# Patient Record
Sex: Male | Born: 1950 | Race: White | Hispanic: No | Smoking: Never smoker
Health system: Southern US, Community
[De-identification: ages and names within clinical notes are randomized; demographics above are authoritative.]

## PROBLEM LIST (undated history)

## (undated) DIAGNOSIS — F0390 Unspecified dementia without behavioral disturbance: Secondary | ICD-10-CM

## (undated) DIAGNOSIS — R569 Unspecified convulsions: Secondary | ICD-10-CM

## (undated) DIAGNOSIS — I10 Essential (primary) hypertension: Secondary | ICD-10-CM

## (undated) DIAGNOSIS — F329 Major depressive disorder, single episode, unspecified: Secondary | ICD-10-CM

## (undated) DIAGNOSIS — F419 Anxiety disorder, unspecified: Secondary | ICD-10-CM

## (undated) DIAGNOSIS — R296 Repeated falls: Secondary | ICD-10-CM

## (undated) DIAGNOSIS — K219 Gastro-esophageal reflux disease without esophagitis: Secondary | ICD-10-CM

## (undated) DIAGNOSIS — M6281 Muscle weakness (generalized): Secondary | ICD-10-CM

## (undated) DIAGNOSIS — E876 Hypokalemia: Secondary | ICD-10-CM

## (undated) DIAGNOSIS — E785 Hyperlipidemia, unspecified: Secondary | ICD-10-CM

## (undated) DIAGNOSIS — R1312 Dysphagia, oropharyngeal phase: Secondary | ICD-10-CM

## (undated) DIAGNOSIS — S0990XA Unspecified injury of head, initial encounter: Secondary | ICD-10-CM

## (undated) HISTORY — DX: Dysphagia, oropharyngeal phase: R13.12

## (undated) HISTORY — DX: Gastro-esophageal reflux disease without esophagitis: K21.9

## (undated) HISTORY — PX: TRACHEOSTOMY: SUR1362

## (undated) HISTORY — PX: TRACHEOSTOMY CLOSURE: SHX458

## (undated) HISTORY — DX: Unspecified injury of head, initial encounter: S09.90XA

## (undated) HISTORY — DX: Hyperlipidemia, unspecified: E78.5

---

## 2003-05-23 ENCOUNTER — Emergency Department (HOSPITAL_COMMUNITY): Admission: EM | Admit: 2003-05-23 | Discharge: 2003-05-23 | Payer: Self-pay | Admitting: Emergency Medicine

## 2004-05-02 ENCOUNTER — Inpatient Hospital Stay (HOSPITAL_COMMUNITY): Admission: EM | Admit: 2004-05-02 | Discharge: 2004-05-04 | Payer: Self-pay | Admitting: Emergency Medicine

## 2004-05-05 ENCOUNTER — Ambulatory Visit (HOSPITAL_COMMUNITY): Admission: RE | Admit: 2004-05-05 | Discharge: 2004-05-05 | Payer: Self-pay | Admitting: Family Medicine

## 2004-07-15 ENCOUNTER — Observation Stay (HOSPITAL_COMMUNITY): Admission: EM | Admit: 2004-07-15 | Discharge: 2004-07-16 | Payer: Self-pay | Admitting: Emergency Medicine

## 2004-11-03 ENCOUNTER — Emergency Department (HOSPITAL_COMMUNITY): Admission: EM | Admit: 2004-11-03 | Discharge: 2004-11-03 | Payer: Self-pay | Admitting: Emergency Medicine

## 2005-01-14 ENCOUNTER — Emergency Department (HOSPITAL_COMMUNITY): Admission: EM | Admit: 2005-01-14 | Discharge: 2005-01-14 | Payer: Self-pay | Admitting: Emergency Medicine

## 2005-05-04 ENCOUNTER — Emergency Department (HOSPITAL_COMMUNITY): Admission: EM | Admit: 2005-05-04 | Discharge: 2005-05-04 | Payer: Self-pay | Admitting: Emergency Medicine

## 2005-06-21 ENCOUNTER — Emergency Department (HOSPITAL_COMMUNITY): Admission: EM | Admit: 2005-06-21 | Discharge: 2005-06-21 | Payer: Self-pay | Admitting: Emergency Medicine

## 2005-07-20 ENCOUNTER — Emergency Department (HOSPITAL_COMMUNITY): Admission: EM | Admit: 2005-07-20 | Discharge: 2005-07-20 | Payer: Self-pay | Admitting: Emergency Medicine

## 2005-10-28 ENCOUNTER — Emergency Department (HOSPITAL_COMMUNITY): Admission: EM | Admit: 2005-10-28 | Discharge: 2005-10-28 | Payer: Self-pay | Admitting: Emergency Medicine

## 2005-11-05 ENCOUNTER — Emergency Department (HOSPITAL_COMMUNITY): Admission: EM | Admit: 2005-11-05 | Discharge: 2005-11-05 | Payer: Self-pay | Admitting: Emergency Medicine

## 2005-12-20 ENCOUNTER — Emergency Department (HOSPITAL_COMMUNITY): Admission: EM | Admit: 2005-12-20 | Discharge: 2005-12-20 | Payer: Self-pay | Admitting: Emergency Medicine

## 2006-01-14 ENCOUNTER — Emergency Department (HOSPITAL_COMMUNITY): Admission: EM | Admit: 2006-01-14 | Discharge: 2006-01-14 | Payer: Self-pay | Admitting: Emergency Medicine

## 2006-04-26 ENCOUNTER — Inpatient Hospital Stay (HOSPITAL_COMMUNITY): Admission: EM | Admit: 2006-04-26 | Discharge: 2006-04-27 | Payer: Self-pay | Admitting: Emergency Medicine

## 2006-04-30 ENCOUNTER — Emergency Department (HOSPITAL_COMMUNITY): Admission: EM | Admit: 2006-04-30 | Discharge: 2006-04-30 | Payer: Self-pay | Admitting: Emergency Medicine

## 2006-11-05 ENCOUNTER — Emergency Department (HOSPITAL_COMMUNITY): Admission: EM | Admit: 2006-11-05 | Discharge: 2006-11-05 | Payer: Self-pay | Admitting: Emergency Medicine

## 2007-02-03 ENCOUNTER — Emergency Department (HOSPITAL_COMMUNITY): Admission: EM | Admit: 2007-02-03 | Discharge: 2007-02-03 | Payer: Self-pay | Admitting: Emergency Medicine

## 2007-04-17 ENCOUNTER — Emergency Department (HOSPITAL_COMMUNITY): Admission: EM | Admit: 2007-04-17 | Discharge: 2007-04-17 | Payer: Self-pay | Admitting: Emergency Medicine

## 2007-04-18 ENCOUNTER — Emergency Department (HOSPITAL_COMMUNITY): Admission: EM | Admit: 2007-04-18 | Discharge: 2007-04-18 | Payer: Self-pay | Admitting: Emergency Medicine

## 2007-04-22 ENCOUNTER — Ambulatory Visit: Payer: Self-pay | Admitting: Orthopedic Surgery

## 2007-04-27 ENCOUNTER — Emergency Department (HOSPITAL_COMMUNITY): Admission: EM | Admit: 2007-04-27 | Discharge: 2007-04-27 | Payer: Self-pay | Admitting: Emergency Medicine

## 2007-05-26 ENCOUNTER — Emergency Department (HOSPITAL_COMMUNITY): Admission: EM | Admit: 2007-05-26 | Discharge: 2007-05-26 | Payer: Self-pay | Admitting: Emergency Medicine

## 2007-07-14 ENCOUNTER — Emergency Department (HOSPITAL_COMMUNITY): Admission: EM | Admit: 2007-07-14 | Discharge: 2007-07-14 | Payer: Self-pay | Admitting: Emergency Medicine

## 2007-10-07 ENCOUNTER — Observation Stay (HOSPITAL_COMMUNITY): Admission: EM | Admit: 2007-10-07 | Discharge: 2007-10-08 | Payer: Self-pay | Admitting: Emergency Medicine

## 2007-10-24 ENCOUNTER — Emergency Department (HOSPITAL_COMMUNITY): Admission: EM | Admit: 2007-10-24 | Discharge: 2007-10-24 | Payer: Self-pay | Admitting: Emergency Medicine

## 2007-11-06 ENCOUNTER — Inpatient Hospital Stay (HOSPITAL_COMMUNITY): Admission: EM | Admit: 2007-11-06 | Discharge: 2007-11-07 | Payer: Self-pay | Admitting: Emergency Medicine

## 2007-11-08 ENCOUNTER — Emergency Department (HOSPITAL_COMMUNITY): Admission: EM | Admit: 2007-11-08 | Discharge: 2007-11-08 | Payer: Self-pay | Admitting: Emergency Medicine

## 2007-11-26 ENCOUNTER — Emergency Department (HOSPITAL_COMMUNITY): Admission: EM | Admit: 2007-11-26 | Discharge: 2007-11-26 | Payer: Self-pay | Admitting: Emergency Medicine

## 2008-05-10 ENCOUNTER — Encounter: Payer: Self-pay | Admitting: Emergency Medicine

## 2008-05-11 ENCOUNTER — Inpatient Hospital Stay (HOSPITAL_COMMUNITY): Admission: EM | Admit: 2008-05-11 | Discharge: 2008-05-22 | Payer: Self-pay | Admitting: Internal Medicine

## 2008-05-14 ENCOUNTER — Encounter (INDEPENDENT_AMBULATORY_CARE_PROVIDER_SITE_OTHER): Payer: Self-pay | Admitting: Internal Medicine

## 2008-09-10 ENCOUNTER — Emergency Department (HOSPITAL_COMMUNITY): Admission: EM | Admit: 2008-09-10 | Discharge: 2008-09-10 | Payer: Self-pay | Admitting: Emergency Medicine

## 2008-11-16 ENCOUNTER — Emergency Department (HOSPITAL_COMMUNITY): Admission: EM | Admit: 2008-11-16 | Discharge: 2008-11-16 | Payer: Self-pay | Admitting: Emergency Medicine

## 2008-11-22 ENCOUNTER — Emergency Department (HOSPITAL_COMMUNITY): Admission: EM | Admit: 2008-11-22 | Discharge: 2008-11-23 | Payer: Self-pay | Admitting: Emergency Medicine

## 2009-02-03 ENCOUNTER — Emergency Department (HOSPITAL_COMMUNITY): Admission: EM | Admit: 2009-02-03 | Discharge: 2009-02-03 | Payer: Self-pay | Admitting: Emergency Medicine

## 2009-10-08 ENCOUNTER — Emergency Department (HOSPITAL_COMMUNITY): Admission: EM | Admit: 2009-10-08 | Discharge: 2009-10-09 | Payer: Self-pay | Admitting: Emergency Medicine

## 2009-12-08 ENCOUNTER — Emergency Department (HOSPITAL_COMMUNITY): Admission: EM | Admit: 2009-12-08 | Discharge: 2009-12-08 | Payer: Self-pay | Admitting: Emergency Medicine

## 2010-01-08 ENCOUNTER — Emergency Department (HOSPITAL_COMMUNITY): Admission: EM | Admit: 2010-01-08 | Discharge: 2010-01-08 | Payer: Self-pay | Admitting: Emergency Medicine

## 2010-08-08 ENCOUNTER — Encounter: Payer: Self-pay | Admitting: Family Medicine

## 2010-10-02 LAB — GLUCOSE, CAPILLARY: Glucose-Capillary: 189 mg/dL — ABNORMAL HIGH (ref 70–99)

## 2010-10-02 LAB — URINALYSIS, ROUTINE W REFLEX MICROSCOPIC
Leukocytes, UA: NEGATIVE
Nitrite: NEGATIVE
Specific Gravity, Urine: 1.02 (ref 1.005–1.030)
Urobilinogen, UA: 0.2 mg/dL (ref 0.0–1.0)
pH: 6 (ref 5.0–8.0)

## 2010-10-02 LAB — URINE MICROSCOPIC-ADD ON

## 2010-10-02 LAB — BASIC METABOLIC PANEL
CO2: 29 mEq/L (ref 19–32)
Calcium: 9.4 mg/dL (ref 8.4–10.5)
Creatinine, Ser: 1.42 mg/dL (ref 0.4–1.5)
GFR calc non Af Amer: 52 mL/min — ABNORMAL LOW (ref >60)
Glucose, Bld: 67 mg/dL — ABNORMAL LOW (ref 70–99)
Potassium: 3.9 mEq/L (ref 3.5–5.1)

## 2010-10-02 LAB — PHENYTOIN LEVEL, TOTAL: Phenytoin Lvl: 17.1 ug/mL (ref 10.0–20.0)

## 2010-10-25 LAB — DIFFERENTIAL
Basophils Relative: 1 % (ref 0–1)
Eosinophils Absolute: 0.3 10*3/uL (ref 0.0–0.7)
Eosinophils Relative: 4 % (ref 0–5)
Lymphs Abs: 1.7 10*3/uL (ref 0.7–4.0)
Monocytes Relative: 11 % (ref 3–12)
Neutrophils Relative %: 65 % (ref 43–77)

## 2010-10-25 LAB — CBC
HCT: 42.2 % (ref 39.0–52.0)
MCHC: 34.8 g/dL (ref 30.0–36.0)
MCV: 93 fL (ref 78.0–100.0)
Platelets: 200 10*3/uL (ref 150–400)
WBC: 8.7 10*3/uL (ref 4.0–10.5)

## 2010-10-25 LAB — BASIC METABOLIC PANEL
BUN: 13 mg/dL (ref 6–23)
CO2: 30 mEq/L (ref 19–32)
Chloride: 96 mEq/L (ref 96–112)
Creatinine, Ser: 1 mg/dL (ref 0.4–1.5)
Potassium: 3.6 mEq/L (ref 3.5–5.1)

## 2010-10-25 LAB — CARBAMAZEPINE LEVEL, TOTAL: Carbamazepine Lvl: <4 ug/mL — ABNORMAL LOW (ref 4.0–12.0)

## 2010-10-25 LAB — PHENYTOIN LEVEL, TOTAL: Phenytoin Lvl: 13.2 ug/mL (ref 10.0–20.0)

## 2010-11-01 LAB — URINE CULTURE: Colony Count: NO GROWTH

## 2010-11-01 LAB — URINALYSIS, ROUTINE W REFLEX MICROSCOPIC
Bilirubin Urine: NEGATIVE
Hgb urine dipstick: NEGATIVE
Ketones, ur: NEGATIVE mg/dL
Specific Gravity, Urine: 1.01 (ref 1.005–1.030)
Urobilinogen, UA: 1 mg/dL (ref 0.0–1.0)

## 2010-11-01 LAB — COMPREHENSIVE METABOLIC PANEL
CO2: 27 mEq/L (ref 19–32)
Calcium: 8.9 mg/dL (ref 8.4–10.5)
Creatinine, Ser: 1.08 mg/dL (ref 0.4–1.5)
GFR calc non Af Amer: >60 mL/min (ref >60)
Glucose, Bld: 106 mg/dL — ABNORMAL HIGH (ref 70–99)
Total Protein: 7.1 g/dL (ref 6.0–8.3)

## 2010-11-01 LAB — DIFFERENTIAL
Eosinophils Absolute: 0.2 10*3/uL (ref 0.0–0.7)
Lymphocytes Relative: 13 % (ref 12–46)
Lymphs Abs: 1 10*3/uL (ref 0.7–4.0)
Monocytes Relative: 6 % (ref 3–12)
Neutro Abs: 5.8 10*3/uL (ref 1.7–7.7)
Neutrophils Relative %: 78 % — ABNORMAL HIGH (ref 43–77)

## 2010-11-01 LAB — CBC
Hemoglobin: 15 g/dL (ref 13.0–17.0)
MCHC: 34.4 g/dL (ref 30.0–36.0)
MCV: 93.9 fL (ref 78.0–100.0)
RBC: 4.65 MIL/uL (ref 4.22–5.81)
RDW: 12.4 % (ref 11.5–15.5)

## 2010-11-01 LAB — POCT CARDIAC MARKERS
CKMB, poc: <1 ng/mL — ABNORMAL LOW (ref 1.0–8.0)
Myoglobin, poc: 75.5 ng/mL (ref 12–200)
Troponin i, poc: <0.05 ng/mL (ref 0.00–0.09)

## 2010-11-01 LAB — GLUCOSE, CAPILLARY: Glucose-Capillary: 106 mg/dL — ABNORMAL HIGH (ref 70–99)

## 2010-11-01 LAB — URINE MICROSCOPIC-ADD ON

## 2010-11-01 LAB — PHENYTOIN LEVEL, TOTAL: Phenytoin Lvl: 23.5 ug/mL — ABNORMAL HIGH (ref 10.0–20.0)

## 2010-11-20 ENCOUNTER — Emergency Department (HOSPITAL_COMMUNITY): Payer: Medicaid Other

## 2010-11-20 ENCOUNTER — Emergency Department (HOSPITAL_COMMUNITY)
Admission: EM | Admit: 2010-11-20 | Discharge: 2010-11-20 | Disposition: A | Discharge status: Home / Self Care | Payer: Medicaid Other | Attending: Emergency Medicine | Admitting: Emergency Medicine

## 2010-11-20 DIAGNOSIS — E119 Type 2 diabetes mellitus without complications: Secondary | ICD-10-CM | POA: Insufficient documentation

## 2010-11-20 DIAGNOSIS — G40909 Epilepsy, unspecified, not intractable, without status epilepticus: Secondary | ICD-10-CM | POA: Insufficient documentation

## 2010-11-20 DIAGNOSIS — S01309A Unspecified open wound of unspecified ear, initial encounter: Secondary | ICD-10-CM | POA: Insufficient documentation

## 2010-11-20 DIAGNOSIS — S60229A Contusion of unspecified hand, initial encounter: Secondary | ICD-10-CM | POA: Insufficient documentation

## 2010-11-20 DIAGNOSIS — Y9210 Unspecified residential institution as the place of occurrence of the external cause: Secondary | ICD-10-CM | POA: Insufficient documentation

## 2010-11-20 DIAGNOSIS — W06XXXA Fall from bed, initial encounter: Secondary | ICD-10-CM | POA: Insufficient documentation

## 2010-11-20 DIAGNOSIS — S62329A Displaced fracture of shaft of unspecified metacarpal bone, initial encounter for closed fracture: Secondary | ICD-10-CM | POA: Insufficient documentation

## 2010-11-20 DIAGNOSIS — E785 Hyperlipidemia, unspecified: Secondary | ICD-10-CM | POA: Insufficient documentation

## 2010-11-20 DIAGNOSIS — F79 Unspecified intellectual disabilities: Secondary | ICD-10-CM | POA: Insufficient documentation

## 2010-11-20 DIAGNOSIS — I10 Essential (primary) hypertension: Secondary | ICD-10-CM | POA: Insufficient documentation

## 2010-11-20 DIAGNOSIS — IMO0002 Reserved for concepts with insufficient information to code with codable children: Secondary | ICD-10-CM | POA: Insufficient documentation

## 2010-11-20 DIAGNOSIS — K219 Gastro-esophageal reflux disease without esophagitis: Secondary | ICD-10-CM | POA: Insufficient documentation

## 2010-11-20 DIAGNOSIS — S0100XA Unspecified open wound of scalp, initial encounter: Secondary | ICD-10-CM | POA: Insufficient documentation

## 2010-11-20 DIAGNOSIS — Z79899 Other long term (current) drug therapy: Secondary | ICD-10-CM | POA: Insufficient documentation

## 2010-11-20 DIAGNOSIS — F329 Major depressive disorder, single episode, unspecified: Secondary | ICD-10-CM | POA: Insufficient documentation

## 2010-11-20 DIAGNOSIS — Y998 Other external cause status: Secondary | ICD-10-CM | POA: Insufficient documentation

## 2010-11-20 DIAGNOSIS — F3289 Other specified depressive episodes: Secondary | ICD-10-CM | POA: Insufficient documentation

## 2010-11-20 DIAGNOSIS — F0390 Unspecified dementia without behavioral disturbance: Secondary | ICD-10-CM | POA: Insufficient documentation

## 2010-11-20 DIAGNOSIS — R1310 Dysphagia, unspecified: Secondary | ICD-10-CM | POA: Insufficient documentation
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
## 2010-11-29 NOTE — Discharge Summary (Signed)
[name], SCHROEPFER                 ACCOUNT NO.:  [id number]   MEDICAL RECORD NO.:  [id number]          PATIENT TYPE:  INP   LOCATION:  5501                         FACILITY:  [facility]   PHYSICIAN:  [name], M.D.      DATE OF BIRTH:  Jan 15, 1951   DATE OF ADMISSION:  05/11/2008  DATE OF DISCHARGE:  05/21/2008                               DISCHARGE SUMMARY   PRIMARY CARE PHYSICIAN:  Dr. Renard Matter in [place], [place].   DISCHARGE DIAGNOSIS:  1. Falls with injury injuries.  2. Fracture of the triploid on the right side with no surgery planned.      The patient refused surgery and labs.  3. Seizure disorder.  4. Diabetes.  5. Dilantin toxicity.  6. Urinary tract infection.  7. 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  8. Mental retention.  9. 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   MEDICATIONS:  1. Dilantin 300 mg p.o. nightly  2. Zetia 10 mg daily.  3. Trileptal 450 mg b.i.d. and 300 mg nightly.  4. Senokot-S 1 tablet p.o. nightly.  5. Ativan 1.5 mg q.8 hours p.r.n. agitation.  6. Metformin 500 mg p.o. b.i.d.  7. Pepcid 10 mg daily.  8. Glipizide XL 5 mg daily.   DISPOSITION:  The patient will be transferred back to [facility] skilled  facility.  His seizure medication has been adjusted and will continue on  the same dose.  The patient is also to follow up with Dr. Renard Matter, his  regular physician.   PROCEDURES PERFORMED:  1. Chest x-ray performed October 25 showed no active disease.  2. Pelvic x-ray 2-view also on October 25 showed no acute finding with      old post-traumatic hip about the right hip noted.  3. CT C-spine and CT head performed on October 25 showed no acute      intracranial abnormalities.  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  Also right orbital fracture      that appears to be blood in the right maxillary sinus.  Question      fracture to the floor the right orbit.  Also spondylosis but no  acute abnormality.  4. CT maxillofacial on October 25 showed fractures of the lateral wall      and floor of the right orbit, right zygomatic arch fracture,      fracture through the anterior posterior wall of the right maxillary      sinus.  5. MRI of the brain without contrast on October 26 showed no acute      intracranial findings.  There was encephalomalacia affecting the      inferior cerebellar on the right, both frontal lobes, left more      than right, and the left temporal lobe.  Also enlargement of the      left lateral ventricle.  Changes are seen to be post-traumatic.      Another CT head without contrast on November 2 showed no acute      abnormality except for  the remote infarcts, no change from      previous.  6. CT head without contrast on November 4 showed no acute or      reversible process changes related to old closed head injury as      previously described.   CONSULTATION:  1. Dr. Lucky Cowboy, ENT.  2. Dr. Antonietta Breach, psychiatry.  3. Dr. Sharene Skeans, neurology.   BRIEF HISTORY AND PHYSICAL:  Please refer to dictated history and  physical by Dr. [name].  In short however, this is a 60-year-  old patient with mental retardation resident of nursing home who had  prior history of closed head injury.  The patient came in with passing  out history.  It was unclear whether he had a seizure or a syncopal  episode.  During that study, he fell and he was found to have facial  fractures.  The patient was transferred from [facility] so he  could be seen by ENT.  His vitals were stable on arrival except for  obvious right facial fracture.  There was edema and ecchymosis of the  right orbit on arrival.  He was subsequently admitted for further  management.   HOSPITAL COURSE:  1. Syncope versus seizure.  The patient's syncopal workup was      negative.  However, while in the hospital, he was noted to have      high Dilantin level and had 2 observe  seizures in the hospital.      His medications were adjusted accordingly.  Had a neurology      followup.  The changes made to his medications seemed to control      his seizures ultimately.  He is currently being discharged on his      current regimen.  2. Dilantin toxicity.  His levels were as high as 25 but that has      improved with time.  At the moment his Dilantin has been held, the      level dropped to 17.  He is currently on 300 mg nightly plus the      Trileptal.  3. Seizure disorder.  As indicated the patient has been on medication      and has had 2 observed seizures in the hospital, which are now      controlled.  4. UTI.  The patient also has evidence of UTI during this      hospitalization and was treated appropriately with ceftriaxone.  5. 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  6. Fracture and triploid.  Again the patient was seen by ENT and      evaluated.  Surgery was planned a couple of times.  The patient      refused to go ahead with surgery on November 2.  He had a psych      consult thereafter that determined that he is not competent to make      any decision.  His sister and the patient may agreed to have      surgery done.  However, based on conversation between Dr. Lucky Cowboy, and his sister, it was deemed that surgery was not      appropriate in the patient since he is not really worried about his  appearance, et Karie Soda.  For that reason, surgery has not been done      and the patient will continue conservative measures.      [name], M.D.  Electronically Signed     LG/MEDQ  D:  05/21/2008  T:  05/21/2008  Job:  [id number]   cc:   Lucky Cowboy, MD  Antonietta Breach, M.D.  Deanna Artis. Sharene Skeans, M.D.

## 2010-11-29 NOTE — Discharge Summary (Signed)
[name], [name]                 ACCOUNT NO.:  [id number]   MEDICAL RECORD NO.:  [id number]          PATIENT TYPE:  INP   LOCATION:  A215                          FACILITY:  [facility]   PHYSICIAN:  Angus G. Renard Matter, MD   DATE OF BIRTH:  1950/11/21   DATE OF ADMISSION:  11/06/2007  DATE OF DISCHARGE:  04/23/2009LH                               DISCHARGE SUMMARY   DIAGNOSES:  1. Seizure disorder.  2. Diabetes mellitus type 2.  3. Mental retardation.   Condition stable and improved at time of his discharge.    This 60 year old white male was brought to the emergency room where he  was evaluated, apparently he does have a seizure history.  He had been  having seizures prior to admission, had become unresponsive and was in a  postictal state at the time he was seen by ED physician.   EXAMINATION:  Blood pressure 129/70, respirations 17, pulse 98,  temperature 96.3.  HEENT:  Eyes:  PERRLA.  TMs:  Negative.  Oropharynx:  Benign.  NECK:  Supple.  No JVD or thyroid abnormalities.  HEART:  Regular rhythm, no murmurs.  LUNGS:  Clear to P&A.  ABDOMEN:  No palpable organs or masses.  EXTREMITIES:  Free of edema.  NEUROLOGICAL:  No focal deficit.   LABORATORY DATA:  Admission CBC:  WBC 7900 with hemoglobin 14.2,  hematocrit 40.7.  Chemistries:  Sodium 132, potassium 3.6, chloride 96,  CO2 24, glucose 141, BUN 10, and creatinine of 1.09.  X-rays:  The  patient had a prior CT of the head which showed stable left frontal and  temporal encephalomalacia and an old right cerebellar infarct and  cerebellar atrophy.   HOSPITAL COURSE:  The patient at the time of his admission was placed on  IV normal saline at 100 mL an hour, Ativan 1-2 mg if needed, nasal O2 at  3 liters per minute, a.c. and h.s. blood sugars, NovoLog moderate  sliding scale.  He is continued on metformin 500 mg b.i.d., glipizide XL  5 mg daily, Trileptal 300 mg t.i.d., phenytoin 200 mg b.i.d., Zetia 10  mg daily, Pepcid 10 mg  daily, aspirin 81 mg daily.  The patient had no  further seizures following his hospitalization and it was felt he could  be discharged after 1 day of hospitalization and to be seen in followup  by neurology.   MEDICATIONS AT THE TIME OF DISCHARGE:  1. Metformin 500 mg one b.i.d.  2. Glipizide XL 5 mg daily.  3. Trileptal 300 mg t.i.d.  4. Phenytoin 200 mg b.i.d.  5. Zetia 10 mg daily.  6. Pepcid 10 mg daily.  7. Aspirin 81 mg daily.   The patient was in stable condition at the time of his discharge.      Angus G. Renard Matter, MD  Electronically Signed     AGM/MEDQ  D:  11/15/2007  T:  11/15/2007  Job:  [id number]

## 2010-11-29 NOTE — H&P (Signed)
NAMEEDGARDO, Davidson                 ACCOUNT NO.:  [id number]   MEDICAL RECORD NO.:  [id number]          PATIENT TYPE:  INP   LOCATION:  A215                          FACILITY:  [facility]   PHYSICIAN:  Angus G. Renard Matter, MD   DATE OF BIRTH:  Jun 08, 1951   DATE OF ADMISSION:  DATE OF DISCHARGE:  LH                              HISTORY & PHYSICAL   HISTORY OF PRESENT ILLNESS:  This 60 year old white male was brought  into the emergency room where he was evaluated.  Apparently, he does  have a seizure history and had been having seizures prior to admission.  He had become unresponsive and was in postictal state at the time that  he was seen by ED physician.   SOCIAL HISTORY:  The patient does not smoke or drink alcohol.   FAMILY HISTORY:  See previous record.   PAST MEDICAL HISTORY:  The patient does have a history of non-insulin-  dependent diabetes, previous head injury, mental retardation, and  seizure disorder.   ALLERGIES:  No known drug allergies.   MEDICATIONS:  1. Metformin 500 mg b.i.d.  2. Glipizide 5 mg daily.  3. Trileptal  300 mg t.i.d.  4. Phenytoin 200 mg b.i.d.  5. Zetia 10 mg daily.  6. Pepcid 10 mg daily.  7. Aspirin 81 mg daily.  8. Ativan 1 mg every 4 hours.   REVIEW OF SYSTEMS:  HEENT:  Negative.  CARDIOPULMONARY:  No cough,  hemoptysis, or dyspnea.  GI:  No bowel irregularity or bleeding.   PHYSICAL EXAMINATION:  VITAL SIGNS:  Blood pressure 129/70, respirations  17, pulse 98, and temperature 96.3.  HEENT:  Eyes, PERRLA.  TMs negative.  Oropharynx benign.  NECK:  Supple.  No JVD or thyroid abnormalities.  HEART:  Regular rhythm.  No murmurs.  LUNGS:  Clear to P&A.  ABDOMEN:  No palpable organs or masses.  EXTREMITIES:  Free of edema.  NEUROLOGICAL:  No focal deficit.   ASSESSMENT:  The patient was admitted in postictal state following  seizure.  He does have a seizure disorder.  He does have mental  retardation and diabetes.  A previous CT of the head  showed stable left  frontal and temporal encephalomalacia and old right cerebellar infarct,  stable mild cerebral and cerebellar atrophy.      Angus G. Renard Matter, MD  Electronically Signed     AGM/MEDQ  D:  11/06/2007  T:  11/07/2007  Job:  [id number]

## 2010-11-29 NOTE — Group Therapy Note (Signed)
[name], [name]                 ACCOUNT NO.:  [id number]   MEDICAL RECORD NO.:  [id number]          PATIENT TYPE:  INP   LOCATION:  A215                          FACILITY:  [facility]   PHYSICIAN:  Angus G. Renard Matter, MD   DATE OF BIRTH:  May 02, 1951   DATE OF PROCEDURE:  DATE OF DISCHARGE:                                 PROGRESS NOTE    This patient was admitted to the hospital in postictal state following  seizures inspite of therapeutic levels of Dilantin.  He remains drowsy.  He does have mental retardation and diabetes.  His blood sugars has been  running from 120-123.   OBJECTIVE:  VITAL SIGNS:  Blood pressure 121/74, respirations 23, pulse  92, and temp 98.  HEENT:  Negative.  LUNGS:  Clear to P&A.  HEART:  Regular rhythm.  ABDOMEN:  No palpable organs or masses.  NEUROLOGIC:  No focal deficit.   ASSESSMENT:  The patient does have a history of seizure disorder, was  admitted with a history of having had multiple seizures recently with a  postictal state.   PLAN:  Plan to continue to monitor.  The patient may be discharged  possibly later today.  We will repeat Dilantin level.      Angus G. Renard Matter, MD  Electronically Signed     AGM/MEDQ  D:  11/07/2007  T:  11/07/2007  Job:  [id number]
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
## 2010-11-29 NOTE — H&P (Signed)
[name], [name]                 ACCOUNT NO.:  [id number]   MEDICAL RECORD NO.:  [id number]          PATIENT TYPE:  OBV   LOCATION:  A315                          FACILITY:  [facility]   PHYSICIAN:  Angus G. Renard Matter, MD   DATE OF BIRTH:  [birth year]   DATE OF ADMISSION:  10/07/2007  DATE OF DISCHARGE:  LH                              HISTORY & PHYSICAL   HISTORY OF PRESENT ILLNESS:  This patient apparently was admitted to the  emergency department following a seizure which he had had at home.  The  patient does have a history of seizure disorder.  He was brought into  the emergency department and was mentally confused.  Was thought to be  in a postictal state.  Had resolved by the time he got into the  emergency room.  The patient does have a history of diabetes and mental  retardation.   LABORATORY DATA:  His laboratory data was done in the emergency  department and shows a low serum sodium 130, potassium 3.7, chloride 93,  CO2 29, glucose 89, BUN 11, creatinine 1.09.  Dilantin level was 23.4.  CBC:  WBC 7700, hemoglobin 14.8, hematocrit 42.3.  UA was negative.  IV  fluids were started and the patient was subsequently admitted, diagnosis  being seizure, postictal state, hyponatremia.   SOCIAL HISTORY:  The patient does not smoke or drink alcohol, nor does  he use drugs.   PAST MEDICAL HISTORY:  1. Mental retardation.  2. History of head injury.  3. Non-insulin dependent diabetes.  4. Seizure disorder which had been well managed for a considerable      period of time   ALLERGIES:  None.   MEDICATION LIST:  1. Metformin 500 mg b.i.d.  2. Glipizide XL 5 mg daily.  3. Trileptal 300 mg t.i.d.  4. Phenytoin 200 mg b.i.d.  5. Zetia 10 mg daily.  6. Pepcid one tablet daily.  7. Aspirin 81 mg daily.  8. Tussionex teaspoon every 12 hours as needed.   ALLERGIES:  No known allergies.   REVIEW OF SYSTEMS:  HEENT:  Negative.  CARDIOPULMONARY:  No cough, chest  pain or palpitations.   GI:  No nausea, vomiting or diarrhea.  GU: No  dysuria, hematuria.  NEUROLOGICAL:  Positive for altered mental status.   PHYSICAL EXAMINATION:  GENERAL:  Lethargic white male.  VITAL SIGNS:  Blood pressure 107/62, respiration 20, pulse 83,  temperature 97.8.  HEENT:  Eyes: PERRLA.  TMs negative.  Oropharynx benign.  NECK:  Supple.  No JVD or thyroid abnormalities.  LUNGS:  Clear to P&A.  HEART:  Regular rhythm no murmurs.  ABDOMEN:  No palpable organs or mass.  No organomegaly.  NEUROLOGICAL:  No focal deficit.  The patient slowly responsive.   DIAGNOSIS:  1. Mental retardation.  2. Recent seizure, chronic seizure disorder.  3. Hyponatremia      Angus G. Renard Matter, MD  Electronically Signed     AGM/MEDQ  D:  10/07/2007  T:  10/08/2007  Job:  [id number]

## 2010-11-29 NOTE — Procedures (Signed)
The patient is described as awake and drowsy, left-handed individual  secondary to right-sided hemiparesis.  This 59 year old gentleman was  admitted on May 11, 2008, for injuries due to a recent fall.  The  patient is a resident of a nursing home, has unsteady gait, history of  mental retardation, ecchymosis below the right eye, history of seizures,  none reported PTA, history of previous head injury, non-insulin-  dependent diabetes.   The medications include aspirin, Zetia, Glucotrol, NovoLog insulin,  Protonix, Dilantin, Tylenol, Dilaudid, Zofran, oxycodone, and Ambien  __________ breakfast.   DESCRIPTION:  In the 16-channel EEG recording with 1 channel  representing heart rate and rhythm exclusively.  This patient shows  slightly elevated heart rate in the high 80s with normal sinus rhythm.  The EEG begins in drowsy stages and as the patient awoke, there are some  anterior, frontal, and frontal polar eye movement motion artifact noted.  The patient however was still not moving in regards to the extremities  and face and a posterior dominant background rhythm could be well  established at 7 Hz, which constitutes generalized slowing.  The heart  rate remained stable throughout this recording while the patient had  occasionally jaw tremor and eyelids tremor or twitch.  No EEG  abnormalities were noted in correspondence to these movements.  The  patient appeared to remain drowsy throughout.  Photic stimulation led to  significant electromyographic artifact at the eyelid with rhythmic  twitching at 5 and 7 and later at  9 and 11 Hz initiated by the  frequency of the photic stimulation.  It continued remarkably into the  15 and 17 Hz frequencies, but no epileptiform activity resulted in the  eye blink artifact that the technician was not able to correct.  In  spite of the patient not being fully asleep, he never reached full  stages of her alertness and theta range frequencies  dominate most of the  study.   CONCLUSION:  This is an abnormal EEG due to limited responsiveness of  the patient's chronic drowsy stages even with stimulation and a  generalized slowing in 7 Hz region indicative of a generalized  encephalopathy.      Melvyn Novas, M.D.  Electronically Signed     [id number]  D:  05/12/2008 08:49:35  T:  05/12/2008 11:11:33  Job #:  [id number]

## 2010-12-02 NOTE — H&P (Signed)
[name], [name]                 ACCOUNT NO.:  [id number]   MEDICAL RECORD NO.:  [id number]           PATIENT TYPE:   LOCATION:                                FACILITY:  [facility]   PHYSICIAN:  Angus G. Renard Matter, MD        DATE OF BIRTH:   DATE OF ADMISSION:  07/15/2004  DATE OF DISCHARGE:  12/31/2005LH                                HISTORY & PHYSICAL   HISTORY OF PRESENT ILLNESS:  A 60 year old white male who presented himself  to the ED following a seizure which apparently occurred in the early a.m.  He apparently was noted to have tongue abrasion by ED physician.  He was  given Geodon IM en route and 1000 mg of Dilantin in the form of Cerebyx  infused at 250 mg per hour.   LABORATORY DATA:  WBC 10,000, hemoglobin 15.5, hematocrit 44.4.  Chemistries: Sodium 136, potassium 3.7, chloride 104, CO2 21, glucose 179.  BUN 17, creatinine 1.6, calcium 9.1.  Phenytoin level less than 10.  UA  negative.  With these problems present, the patient was admitted for further  care.   SOCIAL HISTORY:  The patient does not smoke or drink alcohol.   FAMILY HISTORY:  See previous record.   PAST MEDICAL HISTORY/SURGICAL HISTORY:  The patient has a history of closed  head injury in the past, non-insulin-dependent diabetes.   MEDICATION LIST:  Unavailable, but the patient takes Dilantin at home,  glipizide 5 mg daily, and metformin 500 mg daily.   REVIEW OF SYSTEMS:  HEENT negative.  Cardiopulmonary:  No cough, hemoptysis  or dyspnea.  GI:  No bowel or bladder irregularity or bleeding.  GU:  No  dysuria or hematuria.   PHYSICAL EXAMINATION:  GENERAL:  An alert white male.  The patient is  oriented.  VITAL SIGNS:  Blood pressure 112/72, respirations 12, pulse 75, temperature  97.5.  HEENT:  The patient has tongue abrasion.  NECK:  Supple, no JVD or thyroid abnormalities.  LUNGS:  Clear to P&A.  HEART:  Regular rhythm, no murmurs.  ABDOMEN:  No palpable organs or masses.  SKIN:  Warm and dry.  EXTREMITIES:  Free of edema.  NEUROLOGIC:  No focal deficit.   DIAGNOSIS:  1.  Grand mal seizure.  History of seizure disorder.  2.  Diabetes mellitus type 2.     Angu   AGM/MEDQ  D:  07/15/2004  T:  07/15/2004  Job:  [id number]

## 2010-12-02 NOTE — Group Therapy Note (Signed)
NAMECHARLTON, [name]                 ACCOUNT NO.:  [id number]   MEDICAL RECORD NO.:  [id number]          PATIENT TYPE:  INP   LOCATION:  A211                          FACILITY:  [facility]   PHYSICIAN:  Angus G. Renard Matter, M.D. DATE OF BIRTH:  07/04/1951   DATE OF PROCEDURE:  DATE OF DISCHARGE:                                   PROGRESS NOTE   SUBJECTIVE:  This patient has had no further seizure activity.  He was  admitted following a seizure which occurred at home.  He does have a history  of mental retardation and diabetes.   PHYSICAL EXAMINATION:  VITAL SIGNS:  Blood pressure 120/69, respirations 20,  pulse 84, temperature 97.4.  HEART:  Regular rhythm.  LUNGS:  Clear to percussion and auscultation.  No palpable organs or masses.   ASSESSMENT:  The patient was admitted with history of having suffered grand  mal type seizure.  His condition is stable.   PLAN:  Obtain neurology consult today.  Continue current regimen.      AGM/MEDQ  D:  05/03/2004  T:  05/03/2004  Job:  [id number]

## 2010-12-02 NOTE — Discharge Summary (Signed)
[name], SCHNAPP                 ACCOUNT NO.:  [id number]   MEDICAL RECORD NO.:  [id number]          PATIENT TYPE:  INP   LOCATION:  A218                          FACILITY:  [facility]   PHYSICIAN:  Angus G. Renard Matter, MD   DATE OF BIRTH:  01/22/1951   DATE OF ADMISSION:  07/15/2004  DATE OF DISCHARGE:  12/31/2005LH                                 DISCHARGE SUMMARY   This 60 year old white male admitted July 15, 2004 and discharged  July 16, 2004, one day hospitalization.   DIAGNOSIS:  Grand mal seizure, history of epilepsy, history of mental  retardation, diabetes mellitus type 2.   CONDITION ON DISCHARGE:  Condition stable and improved at the time of  discharge.   This 60 year old white male presents to the ED following a seizure which  apparently occurred in early a.m.  He was noted to have a tongue abrasion.  ED physician has given Geodon IM en route and 1000 mg of Dilantin in the  form of Cerebryx infused at 250 mg/hour.   PHYSICAL EXAMINATION:  On examination, this is an alert, white male.  The  patient was oriented.  Vital signs:  Blood pressure 112/72, respirations 12,  pulse 75, temperature 97.5.  HEENT:  The patient has tongue abrasion.  Neck  is supple with no JVD or thyroid abnormalities.  Lungs:  Clear to  auscultation and percussion.  Heart:  Regular rhythm with no murmurs.  Abdomen:  No palpable organs or masses.  Skin:  Warm and dry.  Extremities  are free of edema.  Neurological:  No focal deficits.   LABORATORY DATA:  Admission CBC:  WBC 10,000, hemoglobin 15.5, hematocrit  44.4.  Chemistries:  Sodium 136, potassium 3.7, chloride 104, CO2 21.  Glucose 179, BUN 17, creatinine 1.6.  Calcium 9.1.  Phenytoin level less  than 10.  Alcohol and drug screen negative.   HOSPITAL COURSE:  The patient was given Dilantin in the emergency room.  He  was placed on 1800 calorie ADA diet, half-normal saline KVO rate.  He was  continued on Metformin 500 mg daily, glipizide 5  mg daily, Zetia 10 mg  daily, Trileptal 300 mg b.i.d., Dilantin 100 mg t.i.d..  Accu-Cheks a.c. and  h.s.  Bathroom privileges with help.  The patient had no further seizures  during his hospital stay, and it was felt that he had a seizure prior to  admission secondary to low Dilantin level.  Apparently, the patient had not  been taking his medication appropriately.  It was felt that he could be  discharged after one day hospitalization to be followed as an outpatient.  He is discharged on a low sugar, low carbohydrate diet.   DISCHARGE MEDICATIONS:  1.  Zetia 10 mg daily.  2. Glucophage 500 mg daily.  3. Glipizide 5 mg      daily.  4. Trileptal 300 mg t.i.d.  5. Dilantin 100 mg t.i.d.     Angu   AGM/MEDQ  D:  08/01/2004  T:  08/01/2004  Job:  [id number]
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
## 2010-12-02 NOTE — H&P (Signed)
[name], [name]                 ACCOUNT NO.:  [id number]   MEDICAL RECORD NO.:  [id number]          PATIENT TYPE:  INP   LOCATION:  A211                          FACILITY:  [facility]   PHYSICIAN:  Angus G. Renard Matter, M.D. DATE OF BIRTH:  12-14-1950   DATE OF ADMISSION:  05/02/2004  DATE OF DISCHARGE:  LH                                HISTORY & PHYSICAL   This is a 60 year old white male who was brought in through the ED with  altered mental status after having suffered a seizure.  Apparently was  combative following the seizure.  Had urinary incontinence.  The patient was  evaluated in the ED by the ED physician.  Apparently CT of the head showed  evidence of encephalomalacia which developed since November 2004.  The  patient was treated in the ED with Geodon, was given loading doses of  Dilantin 1 g and Ativan 2 mg, thiamine 100 mg Geodon 10 mg.  This controlled  the seizure activity, and he was subsequently admitted.   SOCIAL HISTORY:  The patient does not smoke or drink.   FAMILY HISTORY:  See previous records.   PAST MEDICAL AND SURGICAL HISTORY:  1.  The patient is mentally retarded.  2.  He does have a history of non-insulin-dependent diabetes.   ALLERGIES:  No known allergies.   MEDICATIONS:  1.  He takes Glucotrol at home, questionably 5 mg daily.  2.  Metformin 5 mg daily.   REVIEW OF SYSTEMS:  HEENT:  Negative.  CARDIOPULMONARY:  No cough,  hemoptysis, dyspnea.  GI:  No bowel irregularity or bleeding.  GU:  No dysuria or hematuria.   PHYSICAL EXAMINATION:  GENERAL:  Lethargic male.  VITAL SIGNS:  Blood pressure 129/81, pulse 88, respirations 22.  HEENT:  Eyes:  PERRLA.  TMs negative.  Oropharynx benign.  NECK:  Supple without JVD or thyroid abnormalities.  LUNGS:  Clear to auscultation and percussion.  HEART:  Regular rhythm.  ABDOMEN:  No palpable organs or masses.  SKIN:  Warm and dry.  EXTREMITIES:  Free of edema.  NEUROLOGIC:  No motor or sensory deficit.   DIAGNOSES:  1.  Acute seizure disorder.  2.  History of non-insulin-dependent diabetes.      AGM/MEDQ  D:  05/02/2004  T:  05/02/2004  Job:  [id number]
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
## 2010-12-02 NOTE — Group Therapy Note (Signed)
NAME:  [name], [name]                 ACCOUNT NO.:  [id number]   MEDICAL RECORD NO.:  [id number]          PATIENT TYPE:  INP   LOCATION:                                FACILITY:  [facility]   PHYSICIAN:  Angus G. Renard Matter, M.D. DATE OF BIRTH:  [birth year]   DATE OF PROCEDURE:  DATE OF DISCHARGE:                                   PROGRESS NOTE   This patient was admitted following grand mal seizure which occurred at  home.  Does have a history of mental retardation and diabetes.  His blood  sugars have ranged from 144-163.  Patent has had no further seizure  activity.  Remains on Dilantin.   OBJECTIVE:  VITAL SIGNS:  Blood pressure 122/80, respirations 20, pulse 83,  temperature 98.4.  HEART:  Regular rhythm.  LUNGS:  Clear to P&A.  ABDOMEN:  No palpable organs or masses.   ASSESSMENT:  Patient was admitted with history of having suffered grand mal  type seizure.  His condition remains stable.   PLAN:  Continue current regimen.  Will attempt to complete EEG and further  evaluation.       ___________________________________________  Ishmael Holter Renard Matter, M.D.    AGM/MEDQ  D:  05/04/2004  T:  05/04/2004  Job:  [id number]

## 2010-12-02 NOTE — Discharge Summary (Signed)
NAMEDONAVEN, CRISWELL                 ACCOUNT NO.:  [id number]   MEDICAL RECORD NO.:  [id number]          PATIENT TYPE:  INP   LOCATION:  A314                          FACILITY:  [facility]   PHYSICIAN:  Catalina Pizza, M.D.        DATE OF BIRTH:  07-26-1950   DATE OF ADMISSION:  04/26/2006  DATE OF DISCHARGE:  10/12/2007LH                                 DISCHARGE SUMMARY   Please refer to today's progress note for full physical exam and assessment  and plan.   DISCHARGE DIAGNOSES:  1. Soft tissue contusion and gluteal hematoma with right hip pain.  2. Encephalomalacia.  3. Seizure disorder with supratherapeutic Dilantin level.  4. Diabetes mellitus type 2.   DISCHARGE MEDICATIONS:  1. Glipizide 5 mg p.o. daily.  2. Metformin 500 mg p.o. b.i.d.  3. Trileptal 300 mg p.o. t.i.d.  4. Dilantin 100 mg p.o. t.i.d.  5. Darvocet-N 100 1 tab q.4h. p.r.n.   DISPOSITION:  Patient will be discharged back to a rest home followup to  recheck Dilantin level on Monday.  Local pain management for p.r.n. pain  medicine.  He has not required any pain medicine today for the pain in his  right hip.  He does have generalized pain complaints, which move around  since he has been in the hospital.  Patient will return back to [facility]  for further rest home needs, given his significant mental debility.      Catalina Pizza, M.D.  Electronically Signed     ZH/MEDQ  D:  04/27/2006  T:  04/27/2006  Job:  [id number]
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
## 2010-12-02 NOTE — Procedures (Signed)
NAMECADEN, FUKUSHIMA                 ACCOUNT NO.:  [id number]   MEDICAL RECORD NO.:  [id number]           PATIENT TYPE:   LOCATION:                                 FACILITY:   PHYSICIAN:  Kofi A. Gerilyn Pilgrim, M.D.      DATE OF BIRTH:   DATE OF PROCEDURE:  DATE OF DISCHARGE:                                EEG INTERPRETATION   HISTORY:  This is a 60 year old man who is suspected of having seizures.   ANALYSIS:  A 16 channel recording is conducted for approximately 20 minutes.  There is a posterior rhythm of 9 to 10 Hz bilaterally, which attenuates with  eye opening.  Beta activity is seen in the frontal area.  Photic stimulation  does not elicit any abnormal responses.  Awake and drowsy activities are  noted throughout the recording.  There is no focal slowing, lateralized  slowing or epileptiform activity noted.   IMPRESSION:  This is a normal recording of the awake and drowsy states.  If  clinically indicated, a sleep deprived recording may be useful.     Kofi   KAD/MEDQ  D:  05/10/2004  T:  05/10/2004  Job:  [id number]

## 2010-12-02 NOTE — Discharge Summary (Signed)
[name], [name]                 ACCOUNT NO.:  [id number]   MEDICAL RECORD NO.:  [id number]          PATIENT TYPE:  INP   LOCATION:  A218                          FACILITY:  [facility]   PHYSICIAN:  Angus G. Renard Matter, MD   DATE OF BIRTH:  1950/12/27   DATE OF ADMISSION:  07/15/2004  DATE OF DISCHARGE:  12/31/2005LH                                 DISCHARGE SUMMARY   DIAGNOSES:  1.  Grand mal seizure.  2.  History of seizure disorder.  3.  Diabetes mellitus type 2.   CONDITION ON DISCHARGE:  Stable and improved.   A 60 year old white male presented himself to the emergency room following a  seizure which apparently occurred in early a.m.  He apparently was noted to  have tongue abrasion by ED physician.  Was given Geodon IM en route and 1000  mg of Dilantin in the form of Cerebrex infused at 250 mg/hour.   PHYSICAL EXAMINATION:  GENERAL:  An alert white male oriented.  VITAL SIGNS:  Blood pressure 112/72, respirations 12, pulse 75, temperature  97.5.  HEENT:  Eyes:  PERRLA.  TMs negative.  Oropharynx benign.  There is a tongue  abrasion present.  NECK:  Supple.  No JVD or thyroid abnormalities.  LUNGS:  Clear to P&A.  HEART:  Regular rhythm.  No murmurs.  ABDOMEN:  No palpable organs or masses.  SKIN:  Warm and dry.  EXTREMITIES:  Free of edema.  NEUROLOGIC:  No focal deficit.   LABORATORY DATA:  Admission CBC:  WBC 10,000, hemoglobin 15.5.  Chemistries:  Sodium 136, potassium 3.7, chloride 104, CO2 21, glucose 179, BUN 17,  creatinine 1.6.  Phenytoin level less than 10.  Drug screen negative.   HOSPITAL COURSE:  Patient was given intravenous Cerebrex through the  emergency room and Geodon.  Seizure precautions were started.  Patient was  placed on an 1800 calorie ADA diet, half normal saline 50 mL/hour.  Was  continued on Metformin 500 mg daily, Glipizide 5 mg p.o. daily, Zetia 10 mg  daily, Trileptal 300 mg b.i.d., and Dilantin 100 mg t.i.d.  Accu-Cheks were  monitored a.c.  and h.s.  Patient showed progressive improvement.  Did not  have any further seizures.  Apparently had not been getting his medicine  appropriately at home at the time this  seizure occurred.  He was able to be discharged after one day  hospitalization.  He was placed on Zetia 10 mg daily, Glucophage 500 mg  daily, Glipizide 5 mg daily, Trileptal 300 mg t.i.d., Dilantin 100 mg  t.i.d., and Zetia 10 mg daily.  Patient was asked to return to the office  for a follow-up visit.     Angu   AGM/MEDQ  D:  07/29/2004  T:  07/29/2004  Job:  [id number]

## 2010-12-02 NOTE — Discharge Summary (Signed)
NAMERICK, CARRUTHERS                 ACCOUNT NO.:  [id number]   MEDICAL RECORD NO.:  [id number]          PATIENT TYPE:  INP   LOCATION:  A211                          FACILITY:  [facility]   PHYSICIAN:  Angus G. Renard Matter, MD   DATE OF BIRTH:  11-Feb-1951   DATE OF ADMISSION:  05/02/2004  DATE OF DISCHARGE:  10/19/2005LH                                 DISCHARGE SUMMARY   DIAGNOSES:  1.  Seizure disorder with convulsion.  2.  Diabetes mellitus, type 2.  3.  Mental retardation.   CONDITION ON DISCHARGE:  Improved at the time of discharge.   HISTORY OF PRESENT ILLNESS:  The patient is a 60 year old white male who was  brought in through the ED with altered mental status after having suffered a  seizure.  Apparently, the patient was combative following the seizure.  He  had urinary incontinence.  The patient was evaluated in the ED by the ED  physician.  A CT of the head showed evidence of encephalomalacia which had  developed since November of 2004.  The patient was treated in the ED with  Geodon.  He was given a loading dose of Dilantin, Ativan 2 mg, thiamine 100  mg.  This controlled his seizure activity, and he was subsequently admitted.   PHYSICAL EXAMINATION:  GENERAL:  Lethargic white male.  VITAL SIGNS:  Blood pressure 128/81, pulse 88, respirations 22.  HEENT:  Eyes:  PERRLA.  Tympanic membranes negative.  Oropharynx benign.  NECK:  Supple, no JVD or thyroid abnormalities.  LUNGS:  Clear to P&A.  HEART:  Regular rhythm.  ABDOMEN:  No palpable organs or masses.  SKIN:  Warm and dry.  EXTREMITIES:  Free of edema.  NEUROLOGIC:  No focal deficit.   LABORATORY DATA:  Admission CBC:  WBC 9200, hemoglobin 14.4, hematocrit  42.5.  Chemistries:  Sodium 139, potassium 3.7, chloride 106, CO2 25,  glucose 108, BUN 16, creatinine 1.4, calcium 9.0.  Phenytoin 10.5.   CT of the head showed no acute intracranial abnormality, encephalomalacia  involving the left frontal and temporal lobes, mild  bilateral posterior  ethmoid and sphenoid sinusitis.  Chest x-ray revealed no evidence of acute  disease.   HOSPITAL COURSE:  The patient, at the time of his admission, was placed on  seizure precautions, IV fluids with half-normal saline, 125cchr, neurologic  checks every four hours.  He was continued on Geodon 20 mg q.12h, Dilantin  200 mg each a.m. and 200 mg at bedtime, Ativan 1 mg every four hours p.r.n.  agitation.  He was placed on Accu-Checks q.a.c. and q.h.s. and Humalog  insulin according to sliding scale.  He was continued on thiamine.  He was  placed on a 2000 calorie ADA diet.  EEG was scheduled as an outpatient.  The  patient improved during his hospital stay.  He did not have any further  seizure activity.  He was seen in consultation by neurology.  An EEG was  ordered.  The patient progressively improved and was able to be discontinued  after two days of hospitalization.   DISCHARGE  MEDICATIONS:  1.  Metformin 500 mg daily.  2.  Glucotrol XL 5 mg daily.  3.  Dilantin 200 mg twice a day.   FOLLOW UP:  He was asked to return to the office for followup.     Angu   AGM/MEDQ  D:  05/17/2004  T:  05/17/2004  Job:  [id number]

## 2011-04-10 LAB — DIFFERENTIAL
Lymphocytes Relative: 15
Monocytes Absolute: 0.6
Monocytes Relative: 8
Neutro Abs: 5.6

## 2011-04-10 LAB — CBC
HCT: 42.3
MCV: 90.9
Platelets: 287
RDW: 13.9

## 2011-04-10 LAB — BASIC METABOLIC PANEL
Calcium: 8.7
Creatinine, Ser: 1.16
GFR calc non Af Amer: >60
Glucose, Bld: 92
Sodium: 132 — ABNORMAL LOW

## 2011-04-10 LAB — URINALYSIS, ROUTINE W REFLEX MICROSCOPIC
Glucose, UA: NEGATIVE
Specific Gravity, Urine: 1.015
Urobilinogen, UA: 0.2
pH: 7

## 2011-04-10 LAB — COMPREHENSIVE METABOLIC PANEL
Albumin: 4
BUN: 11
Creatinine, Ser: 1.09
GFR calc Af Amer: >60
Potassium: 3.7
Total Protein: 7

## 2011-04-10 LAB — URINE MICROSCOPIC-ADD ON

## 2011-04-11 LAB — BASIC METABOLIC PANEL
BUN: 10
Calcium: 8.8
Creatinine, Ser: 1.09
GFR calc non Af Amer: >60
Glucose, Bld: 141 — ABNORMAL HIGH
Sodium: 132 — ABNORMAL LOW

## 2011-04-11 LAB — DIFFERENTIAL
Basophils Absolute: 0
Basophils Relative: 1
Lymphocytes Relative: 15
Neutro Abs: 5.8
Neutrophils Relative %: 73

## 2011-04-11 LAB — CBC
Hemoglobin: 14.2
MCHC: 35
Platelets: 246
RDW: 12.9

## 2011-04-11 LAB — PHENYTOIN LEVEL, TOTAL: Phenytoin Lvl: 22.8 — ABNORMAL HIGH

## 2011-04-17 LAB — ETHANOL: Alcohol, Ethyl (B): <5

## 2011-04-17 LAB — URINE CULTURE: Colony Count: >=100000

## 2011-04-17 LAB — COMPREHENSIVE METABOLIC PANEL
ALT: 16
AST: 15
Albumin: 3.9
CO2: 29
Chloride: 96
Creatinine, Ser: 1.31
GFR calc Af Amer: >60
GFR calc non Af Amer: 57 — ABNORMAL LOW
Sodium: 134 — ABNORMAL LOW
Total Bilirubin: 0.5

## 2011-04-17 LAB — CBC
Platelets: 271
RBC: 4.64
WBC: 12.5 — ABNORMAL HIGH

## 2011-04-17 LAB — URINALYSIS, ROUTINE W REFLEX MICROSCOPIC
Glucose, UA: NEGATIVE
Hgb urine dipstick: NEGATIVE
Protein, ur: NEGATIVE
pH: 7

## 2011-04-17 LAB — DIFFERENTIAL
Eosinophils Absolute: 0.2
Eosinophils Relative: 2
Lymphocytes Relative: 11 — ABNORMAL LOW
Lymphs Abs: 1.4
Monocytes Absolute: 1.1 — ABNORMAL HIGH

## 2011-04-17 LAB — PHENYTOIN LEVEL, TOTAL: Phenytoin Lvl: 25.1 — ABNORMAL HIGH

## 2011-04-18 LAB — HEMOGLOBIN A1C

## 2011-04-18 LAB — CBC
HCT: 42.5
HCT: 43.5
Hemoglobin: 14.2
Hemoglobin: 14.7
Hemoglobin: 15.4
MCHC: 33.8
MCV: 93.7
RBC: 4.51
RBC: 4.65
RBC: 4.96
RDW: 12.9
WBC: 10
WBC: 9.1

## 2011-04-18 LAB — URINALYSIS, ROUTINE W REFLEX MICROSCOPIC
Bilirubin Urine: NEGATIVE
Glucose, UA: NEGATIVE
Ketones, ur: NEGATIVE
Leukocytes, UA: NEGATIVE
Protein, ur: 30 — AB

## 2011-04-18 LAB — GLUCOSE, CAPILLARY
Glucose-Capillary: 101 — ABNORMAL HIGH
Glucose-Capillary: 109 — ABNORMAL HIGH
Glucose-Capillary: 110 — ABNORMAL HIGH
Glucose-Capillary: 112 — ABNORMAL HIGH
Glucose-Capillary: 113 — ABNORMAL HIGH
Glucose-Capillary: 117 — ABNORMAL HIGH
Glucose-Capillary: 121 — ABNORMAL HIGH
Glucose-Capillary: 128 — ABNORMAL HIGH
Glucose-Capillary: 134 — ABNORMAL HIGH
Glucose-Capillary: 137 — ABNORMAL HIGH
Glucose-Capillary: 138 — ABNORMAL HIGH
Glucose-Capillary: 138 — ABNORMAL HIGH
Glucose-Capillary: 142 — ABNORMAL HIGH
Glucose-Capillary: 144 — ABNORMAL HIGH
Glucose-Capillary: 152 — ABNORMAL HIGH
Glucose-Capillary: 178 — ABNORMAL HIGH
Glucose-Capillary: 187 — ABNORMAL HIGH
Glucose-Capillary: 79
Glucose-Capillary: 90
Glucose-Capillary: 94
Glucose-Capillary: 114 — ABNORMAL HIGH
Glucose-Capillary: 121 — ABNORMAL HIGH
Glucose-Capillary: 124 — ABNORMAL HIGH
Glucose-Capillary: 131 — ABNORMAL HIGH
Glucose-Capillary: 132 — ABNORMAL HIGH
Glucose-Capillary: 134 — ABNORMAL HIGH
Glucose-Capillary: 136 — ABNORMAL HIGH
Glucose-Capillary: 140 — ABNORMAL HIGH
Glucose-Capillary: 143 — ABNORMAL HIGH
Glucose-Capillary: 144 — ABNORMAL HIGH
Glucose-Capillary: 145 — ABNORMAL HIGH
Glucose-Capillary: 168 — ABNORMAL HIGH
Glucose-Capillary: 174 — ABNORMAL HIGH
Glucose-Capillary: 210 — ABNORMAL HIGH

## 2011-04-18 LAB — COMPREHENSIVE METABOLIC PANEL
ALT: 20
Alkaline Phosphatase: 183 — ABNORMAL HIGH
CO2: 25
GFR calc non Af Amer: >60
Glucose, Bld: 129 — ABNORMAL HIGH
Potassium: 4.2
Sodium: 128 — ABNORMAL LOW
Total Protein: 7

## 2011-04-18 LAB — DIFFERENTIAL
Basophils Relative: 1
Basophils Relative: 1
Eosinophils Absolute: 0.1
Eosinophils Relative: 2
Monocytes Absolute: 0.9
Monocytes Relative: 8
Monocytes Relative: 7
Neutro Abs: 8.5 — ABNORMAL HIGH
Neutrophils Relative %: 78 — ABNORMAL HIGH

## 2011-04-18 LAB — BASIC METABOLIC PANEL
CO2: 31
Calcium: 8.8
Calcium: 9
Chloride: 98
GFR calc Af Amer: >60
GFR calc Af Amer: >60
GFR calc non Af Amer: >60
Glucose, Bld: 102 — ABNORMAL HIGH
Glucose, Bld: 88
Potassium: 3.6
Potassium: 4
Sodium: 138
Sodium: 139

## 2011-04-18 LAB — URINE MICROSCOPIC-ADD ON

## 2011-04-18 LAB — PHENYTOIN LEVEL, TOTAL: Phenytoin Lvl: 15.9

## 2011-04-18 LAB — CARDIAC PANEL(CRET KIN+CKTOT+MB+TROPI)
Relative Index: 1.3
Troponin I: <0.01
Troponin I: <0.01
Troponin I: <0.01

## 2011-04-18 LAB — TSH: TSH: 3.009

## 2011-04-25 LAB — DIFFERENTIAL
Basophils Relative: 0
Lymphs Abs: 0.7
Monocytes Relative: 6
Neutro Abs: 9.1 — ABNORMAL HIGH
Neutrophils Relative %: 86 — ABNORMAL HIGH

## 2011-04-25 LAB — BASIC METABOLIC PANEL
BUN: 12
Calcium: 8.7
Creatinine, Ser: 1.2
GFR calc Af Amer: >60

## 2011-04-25 LAB — CBC
Platelets: 266
RBC: 4.8
WBC: 10.6 — ABNORMAL HIGH

## 2011-04-25 LAB — PHENYTOIN LEVEL, TOTAL: Phenytoin Lvl: 19.3

## 2011-04-27 LAB — DIFFERENTIAL
Basophils Absolute: 0
Basophils Relative: 1
Eosinophils Absolute: 0.1
Eosinophils Relative: 1
Lymphocytes Relative: 8 — ABNORMAL LOW
Lymphs Abs: 1.4
Monocytes Absolute: 1 — ABNORMAL HIGH
Monocytes Relative: 8
Neutro Abs: 10.3 — ABNORMAL HIGH
Neutrophils Relative %: 80 — ABNORMAL HIGH
Neutrophils Relative %: 87 — ABNORMAL HIGH

## 2011-04-27 LAB — BASIC METABOLIC PANEL
BUN: 11
CO2: 28
Calcium: 8.5
Chloride: 95 — ABNORMAL LOW
Creatinine, Ser: 1
Creatinine, Ser: 1.14
GFR calc Af Amer: >60
GFR calc non Af Amer: >60
Glucose, Bld: 90
Potassium: 4.1
Sodium: 133 — ABNORMAL LOW

## 2011-04-27 LAB — URINALYSIS, ROUTINE W REFLEX MICROSCOPIC
Bilirubin Urine: NEGATIVE
Glucose, UA: NEGATIVE
Hgb urine dipstick: NEGATIVE
Specific Gravity, Urine: 1.025
Urobilinogen, UA: 0.2
pH: 5.5

## 2011-04-27 LAB — PHENYTOIN LEVEL, TOTAL: Phenytoin Lvl: 23.3 — ABNORMAL HIGH

## 2011-04-27 LAB — CBC
HCT: 40.8
Hemoglobin: 14.8
MCHC: 34.1
Platelets: 393
RBC: 4.75
RDW: 12.7
WBC: 12.9 — ABNORMAL HIGH
WBC: 13.1 — ABNORMAL HIGH

## 2011-04-27 LAB — URINE MICROSCOPIC-ADD ON

## 2012-04-18 ENCOUNTER — Emergency Department (HOSPITAL_COMMUNITY): Payer: Medicaid Other

## 2012-04-18 ENCOUNTER — Encounter (HOSPITAL_COMMUNITY): Payer: Self-pay | Admitting: *Deleted

## 2012-04-18 ENCOUNTER — Emergency Department (HOSPITAL_COMMUNITY)
Admission: EM | Admit: 2012-04-18 | Discharge: 2012-04-19 | Disposition: A | Discharge status: Home / Self Care | Payer: Medicaid Other | Attending: Emergency Medicine | Admitting: Emergency Medicine

## 2012-04-18 DIAGNOSIS — S0990XA Unspecified injury of head, initial encounter: Secondary | ICD-10-CM | POA: Insufficient documentation

## 2012-04-18 DIAGNOSIS — S0101XA Laceration without foreign body of scalp, initial encounter: Secondary | ICD-10-CM

## 2012-04-18 DIAGNOSIS — S0100XA Unspecified open wound of scalp, initial encounter: Secondary | ICD-10-CM | POA: Insufficient documentation

## 2012-04-18 DIAGNOSIS — R296 Repeated falls: Secondary | ICD-10-CM | POA: Insufficient documentation

## 2012-04-18 DIAGNOSIS — F0390 Unspecified dementia without behavioral disturbance: Secondary | ICD-10-CM | POA: Insufficient documentation

## 2012-04-18 DIAGNOSIS — I10 Essential (primary) hypertension: Secondary | ICD-10-CM | POA: Insufficient documentation

## 2012-04-18 DIAGNOSIS — N390 Urinary tract infection, site not specified: Secondary | ICD-10-CM | POA: Insufficient documentation

## 2012-04-18 DIAGNOSIS — R569 Unspecified convulsions: Secondary | ICD-10-CM | POA: Insufficient documentation

## 2012-04-18 DIAGNOSIS — Y9210 Unspecified residential institution as the place of occurrence of the external cause: Secondary | ICD-10-CM | POA: Insufficient documentation

## 2012-04-18 DIAGNOSIS — W19XXXA Unspecified fall, initial encounter: Secondary | ICD-10-CM

## 2012-04-18 DIAGNOSIS — E119 Type 2 diabetes mellitus without complications: Secondary | ICD-10-CM | POA: Insufficient documentation

## 2012-04-18 HISTORY — DX: Unspecified convulsions: R56.9

## 2012-04-18 HISTORY — DX: Essential (primary) hypertension: I10

## 2012-04-18 LAB — BASIC METABOLIC PANEL
CO2: 28 mEq/L (ref 19–32)
Chloride: 99 mEq/L (ref 96–112)
Glucose, Bld: 141 mg/dL — ABNORMAL HIGH (ref 70–99)
Sodium: 137 mEq/L (ref 135–145)

## 2012-04-18 LAB — CBC WITH DIFFERENTIAL/PLATELET
Basophils Absolute: 0.1 10*3/uL (ref 0.0–0.1)
HCT: 41.6 % (ref 39.0–52.0)
Lymphocytes Relative: 20 % (ref 12–46)
Lymphs Abs: 2.1 10*3/uL (ref 0.7–4.0)
Neutro Abs: 7.2 10*3/uL (ref 1.7–7.7)
Platelets: 268 10*3/uL (ref 150–400)
RBC: 4.74 MIL/uL (ref 4.22–5.81)
RDW: 12.7 % (ref 11.5–15.5)
WBC: 10.6 10*3/uL — ABNORMAL HIGH (ref 4.0–10.5)

## 2012-04-18 LAB — URINALYSIS, ROUTINE W REFLEX MICROSCOPIC
Nitrite: NEGATIVE
Urobilinogen, UA: 0.2 mg/dL (ref 0.0–1.0)

## 2012-04-18 LAB — APTT: aPTT: 29 seconds (ref 24–37)

## 2012-04-18 LAB — PROTIME-INR
INR: 0.94 (ref 0.00–1.49)
Prothrombin Time: 12.5 seconds (ref 11.6–15.2)

## 2012-04-18 LAB — PHOSPHORUS: Phosphorus: 2.6 mg/dL (ref 2.3–4.6)

## 2012-04-18 MED ORDER — LORAZEPAM 2 MG/ML IJ SOLN
INTRAMUSCULAR | Status: AC
  Administered 2012-04-18: 2 mg
  Filled 2012-04-18: qty 1

## 2012-04-18 MED ORDER — SODIUM CHLORIDE 0.9 % IV SOLN
Freq: Once | INTRAVENOUS | Status: AC
  Administered 2012-04-18: 22:00:00 via INTRAVENOUS

## 2012-04-18 NOTE — ED Notes (Signed)
Pt had 2 witnessed seizures at [facility], fell last time, struck head on floor, lac to back of head - pt was post ictal on EMS arrival. Pt fully immobilized.
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
## 2012-04-18 NOTE — ED Notes (Signed)
Back board removed with three assist. Pt tolerated well, no pressure spots noted on initial exam but exam was limited due to nature of injury until pt cleared of cspine

## 2012-04-19 MED ORDER — CEPHALEXIN 500 MG PO CAPS: ORAL_CAPSULE | ORAL | Status: DC

## 2012-04-19 MED ORDER — CEPHALEXIN 500 MG PO CAPS
1000.0000 mg | ORAL_CAPSULE | Freq: Once | ORAL | Status: AC
  Administered 2012-04-19: 1000 mg via ORAL
  Filled 2012-04-19: qty 2

## 2012-04-19 NOTE — ED Provider Notes (Signed)
Patient awake alert back to baseline according to family, has dementia confusion of the follow simple commands at this time, respirations are unlabored room-air pulse oximetry normal 97% and lungs are clear to auscultation bilaterally. Questionable urinary tract infection so culture will be ordered and patient placed and antibiotics.  Hurman Horn, MD 04/19/12 [phone]

## 2012-04-19 NOTE — ED Notes (Signed)
Discharge instructions reviewed with pt, questions answered. Pt verbalized understanding.  

## 2012-04-22 LAB — URINE CULTURE: Colony Count: >=100000

## 2012-04-23 NOTE — ED Notes (Signed)
+   Urine Chart sent to EDP office for review. 

## 2012-04-26 NOTE — ED Notes (Signed)
Chart sent to EDP office for review. Rx for Macrobid 100 mg 2 times daily disp-10 written by Marlon Pel PA-C

## 2012-04-27 ENCOUNTER — Telehealth (HOSPITAL_COMMUNITY): Payer: Self-pay | Admitting: Emergency Medicine

## 2012-04-28 ENCOUNTER — Telehealth (HOSPITAL_COMMUNITY): Payer: Self-pay | Admitting: Emergency Medicine

## 2012-05-03 ENCOUNTER — Emergency Department (HOSPITAL_COMMUNITY)
Admission: EM | Admit: 2012-05-03 | Discharge: 2012-05-04 | Disposition: A | Discharge status: Home / Self Care | Payer: Medicaid Other | Attending: Emergency Medicine | Admitting: Emergency Medicine

## 2012-05-03 ENCOUNTER — Encounter (HOSPITAL_COMMUNITY): Payer: Self-pay | Admitting: Emergency Medicine

## 2012-05-03 DIAGNOSIS — W06XXXA Fall from bed, initial encounter: Secondary | ICD-10-CM | POA: Insufficient documentation

## 2012-05-03 DIAGNOSIS — W19XXXA Unspecified fall, initial encounter: Secondary | ICD-10-CM

## 2012-05-03 DIAGNOSIS — S0181XA Laceration without foreign body of other part of head, initial encounter: Secondary | ICD-10-CM

## 2012-05-03 DIAGNOSIS — E119 Type 2 diabetes mellitus without complications: Secondary | ICD-10-CM | POA: Insufficient documentation

## 2012-05-03 DIAGNOSIS — Z79899 Other long term (current) drug therapy: Secondary | ICD-10-CM | POA: Insufficient documentation

## 2012-05-03 DIAGNOSIS — I10 Essential (primary) hypertension: Secondary | ICD-10-CM | POA: Insufficient documentation

## 2012-05-03 DIAGNOSIS — F0390 Unspecified dementia without behavioral disturbance: Secondary | ICD-10-CM | POA: Insufficient documentation

## 2012-05-03 DIAGNOSIS — M5030 Other cervical disc degeneration, unspecified cervical region: Secondary | ICD-10-CM | POA: Insufficient documentation

## 2012-05-03 DIAGNOSIS — S01309A Unspecified open wound of unspecified ear, initial encounter: Secondary | ICD-10-CM | POA: Insufficient documentation

## 2012-05-03 DIAGNOSIS — S0180XA Unspecified open wound of other part of head, initial encounter: Secondary | ICD-10-CM | POA: Insufficient documentation

## 2012-05-03 DIAGNOSIS — S01319A Laceration without foreign body of unspecified ear, initial encounter: Secondary | ICD-10-CM

## 2012-05-03 HISTORY — DX: Unspecified dementia, unspecified severity, without behavioral disturbance, psychotic disturbance, mood disturbance, and anxiety: F03.90

## 2012-05-03 HISTORY — DX: Muscle weakness (generalized): M62.81

## 2012-05-03 NOTE — ED Notes (Signed)
Patient on C collar and backboard.

## 2012-05-03 NOTE — ED Notes (Signed)
Patient from [facility], patient fell out of bed and ambulated and got back into bed himself. Patient with laceration to right ear and to side of right eyebrow.

## 2012-05-04 ENCOUNTER — Emergency Department (HOSPITAL_COMMUNITY): Payer: Medicaid Other

## 2012-05-04 LAB — GLUCOSE, CAPILLARY: Glucose-Capillary: 77 mg/dL (ref 70–99)

## 2012-05-04 NOTE — ED Provider Notes (Signed)
History   This chart was scribed for [employer]. Colon Branch, MD by Sofie Rower. The patient was seen in room APA04/APA04 and the patient's care was started at 11:56PM  Level 5 Caveat: Dementia.   CSN: [id number]  Arrival date & time 05/03/12  2339   First MD Initiated Contact with Patient 05/03/12 2356      Chief Complaint  Patient presents with  . Fall  . Head Laceration    (Consider location/radiation/quality/duration/timing/severity/associated sxs/prior treatment) Patient is a 61 y.o. male presenting with fall and scalp laceration. The history is provided by a relative and the nursing home. The history is limited by the condition of the patient. No language interpreter was used.  Fall The accident occurred 1 to 2 hours ago. The fall occurred from a bed. He fell from an unknown height. He landed on a hard floor. There was no blood loss. The point of impact was the head. The pain is present in the head. The pain is moderate. He was ambulatory at the scene. Treatment on scene includes a c-collar and a backboard. He has tried immobilization for the symptoms. The treatment provided moderate relief.  Head Laceration This is a new problem. The current episode started 1 to 2 hours ago. The problem occurs constantly. The problem has not changed since onset.Nothing aggravates the symptoms. Nothing relieves the symptoms. He has tried nothing for the symptoms. The treatment provided no relief.    [name] is a 61 y.o. male , a resident at [facility] nursing home (for the past 5 years) with a hx of dementia, who presents to the Emergency Department complaining of fall with laceration, located at the right side of the head and laceration to the top of hisright ear, onset today. The pt's relative reports that she was notified by [facility], via telephone call, that the pt fell out of bed this evening. Modifying factors include application of c-collar and spine board en route to [facility], which provide  moderate relief.  The pt does not smoke or drink alcohol.   PCP is Dr. Renard Matter.    Past Medical History  Diagnosis Date  . Seizures   . Hypertension   . Diabetes mellitus   . Muscle weakness   . Dementia     History reviewed. No pertinent past surgical history.  History reviewed. No pertinent family history.  History  Substance Use Topics  . Smoking status: Not on file  . Smokeless tobacco: Not on file  . Alcohol Use: No      Review of Systems  Unable to perform ROS: Dementia    Allergies  Review of patient's allergies indicates no known allergies.  Home Medications   Current Outpatient Rx  Name Route Sig Dispense Refill  . CEPHALEXIN 500 MG PO CAPS  2 caps po bid x 7 days 28 capsule 0    BP 163/99  Pulse 96  Temp 99.1 F (37.3 C) (Oral)  Resp 18  Ht 5\' 9"  (1.753 m)  Wt 180 lb (81.647 kg)  BMI 26.58 kg/m2  SpO2 98%  Physical Exam  Nursing note and vitals reviewed. Constitutional: He appears well-developed and well-nourished.       Awake, alert, nontoxic appearance.  HENT:  Head: Normocephalic.       Laceration located at the right side of face and  Top of right ear.   Eyes: Right eye exhibits no discharge. Left eye exhibits no discharge.  Neck: Neck supple.  Pulmonary/Chest: Effort normal. He  exhibits no tenderness.  Abdominal: Soft. There is no tenderness. There is no rebound.  Musculoskeletal: He exhibits no tenderness.       Baseline ROM, no obvious new focal weakness.  Neurological:       Mental status and motor strength appears baseline for patient and situation.  Skin: Skin is warm. No rash noted.  Psychiatric: He has a normal mood and affect.    ED Course  Procedures (including critical care time)  LACERATION REPAIR Performed by: Annamarie Dawley. Authorized by: Annamarie Dawley Consent: Verbal consent obtained. Risks and benefits: risks, benefits and alternatives were discussed Consent given by: patient's wife Patient identity  confirmed: provided demographic data Laceration Location: right cheek Laceration Length:  1.5 cm No Foreign Bodies seen or palpated Amount of cleaning: standard Skin closure: steri strips Patient tolerance: Patient tolerated the procedure well with no immediate complications.  LACERATION REPAIR Performed by: Annamarie Dawley Authorized by: Annamarie Dawley Consent: Verbal consent obtained. Risks and benefits: risks, benefits and alternatives were discussed Consent given by: patient's wife Patient identity confirmed: provided demographic data Laceration Location: right ear Laceration Length: 2 cm Amount of cleaning: standard Skin closure: dermabond Patient tolerance: Patient tolerated the procedure well with no immediate complications.  DIAGNOSTIC STUDIES: Oxygen Saturation is 98% on room air, normal by my interpretation.    COORDINATION OF CARE: Ct Head Wo Contrast  05/04/2012  *RADIOLOGY REPORT*  Clinical Data:  Fall out of bed, laceration to right ear and side of right eyebrow  CT HEAD WITHOUT CONTRAST CT CERVICAL SPINE WITHOUT CONTRAST  Technique:  Multidetector CT imaging of the head and cervical spine was performed following the standard protocol without intravenous contrast.  Multiplanar CT image reconstructions of the cervical spine were also generated.  Comparison:  04/18/2012  CT HEAD  Findings: No evidence of parenchymal hemorrhage or extra-axial fluid collection. No mass lesion, mass effect, or midline shift.  No CT evidence of acute infarction.  Encephalomalacic changes related to prior left frontal, left frontotemporal, and right cerebellar infarcts.  Global cortical atrophy.  Secondary ventricular prominence.  Ex vacuo dilatation of the left lateral ventricle.  The visualized paranasal sinuses are essentially clear. The mastoid air cells are unopacified.  No evidence of calvarial fracture.  IMPRESSION: No evidence of acute intracranial abnormality.  Multiple prior infarcts.   Atrophy with small vessel ischemic changes.  CT CERVICAL SPINE  Findings: Motion degraded images.  Normal cervical lordosis.  No evidence of fracture or dislocation.  Vertebral body heights are maintained.  The dens appears intact.  No prevertebral soft tissue swelling.  Moderate multilevel degenerative changes.  Visualized thyroid is unremarkable.  Visualized lung apices are clear.  IMPRESSION: No evidence of acute traumatic injury to the cervical spine.  Moderate multilevel degenerative changes.   Original Report Authenticated By: Charline Bills, M.D.    Ct Cervical Spine Wo Contrast  05/04/2012  *RADIOLOGY REPORT*  Clinical Data:  Fall out of bed, laceration to right ear and side of right eyebrow  CT HEAD WITHOUT CONTRAST CT CERVICAL SPINE WITHOUT CONTRAST  Technique:  Multidetector CT imaging of the head and cervical spine was performed following the standard protocol without intravenous contrast.  Multiplanar CT image reconstructions of the cervical spine were also generated.  Comparison:  04/18/2012  CT HEAD  Findings: No evidence of parenchymal hemorrhage or extra-axial fluid collection. No mass lesion, mass effect, or midline shift.  No CT evidence of acute infarction.  Encephalomalacic changes related to prior left frontal, left frontotemporal, and  right cerebellar infarcts.  Global cortical atrophy.  Secondary ventricular prominence.  Ex vacuo dilatation of the left lateral ventricle.  The visualized paranasal sinuses are essentially clear. The mastoid air cells are unopacified.  No evidence of calvarial fracture.  IMPRESSION: No evidence of acute intracranial abnormality.  Multiple prior infarcts.  Atrophy with small vessel ischemic changes.  CT CERVICAL SPINE  Findings: Motion degraded images.  Normal cervical lordosis.  No evidence of fracture or dislocation.  Vertebral body heights are maintained.  The dens appears intact.  No prevertebral soft tissue swelling.  Moderate multilevel degenerative  changes.  Visualized thyroid is unremarkable.  Visualized lung apices are clear.  IMPRESSION: No evidence of acute traumatic injury to the cervical spine.  Moderate multilevel degenerative changes.   Original Report Authenticated By: Charline Bills, M.D.      12:12AM- Treatment plan discussed with pt and pt's relative. Pt's relative agrees with treatment.     MDM  SNF patient who fell from bed sustaining a facial laceration and laceration to right ear. CT head and cervical spine negative for acute injury. Facial laceration was repaired with steri strips. Ear laceration was repaired with dermabond. Patient returned to [facility] nursing home. Pt stable in ED with no significant deterioration in condition.The patient appears reasonably screened and/or stabilized for discharge and I doubt any other medical condition or other [facility] requiring further screening, evaluation, or treatment in the ED at this time prior to discharge.  I personally performed the services described in this documentation, which was scribed in my presence. The recorded information has been reviewed and considered.   MDM Reviewed: nursing note and vitals Interpretation: CT scan           Nicoletta Dress. Colon Branch, MD 05/04/12 [phone]

## 2012-05-04 NOTE — ED Notes (Signed)
Patient removed from backboard with assistance of Dr. Colon Branch and Marchelle Folks, RN

## 2012-06-10 ENCOUNTER — Emergency Department (HOSPITAL_COMMUNITY)
Admission: EM | Admit: 2012-06-10 | Discharge: 2012-06-10 | Disposition: A | Discharge status: Home / Self Care | Payer: Medicaid Other | Attending: Emergency Medicine | Admitting: Emergency Medicine

## 2012-06-10 ENCOUNTER — Encounter (HOSPITAL_COMMUNITY): Payer: Self-pay | Admitting: Emergency Medicine

## 2012-06-10 DIAGNOSIS — Z23 Encounter for immunization: Secondary | ICD-10-CM | POA: Insufficient documentation

## 2012-06-10 DIAGNOSIS — Y939 Activity, unspecified: Secondary | ICD-10-CM | POA: Insufficient documentation

## 2012-06-10 DIAGNOSIS — Z79899 Other long term (current) drug therapy: Secondary | ICD-10-CM | POA: Insufficient documentation

## 2012-06-10 DIAGNOSIS — W19XXXA Unspecified fall, initial encounter: Secondary | ICD-10-CM | POA: Insufficient documentation

## 2012-06-10 DIAGNOSIS — S0181XA Laceration without foreign body of other part of head, initial encounter: Secondary | ICD-10-CM

## 2012-06-10 DIAGNOSIS — I10 Essential (primary) hypertension: Secondary | ICD-10-CM | POA: Insufficient documentation

## 2012-06-10 DIAGNOSIS — F0390 Unspecified dementia without behavioral disturbance: Secondary | ICD-10-CM | POA: Insufficient documentation

## 2012-06-10 DIAGNOSIS — E119 Type 2 diabetes mellitus without complications: Secondary | ICD-10-CM | POA: Insufficient documentation

## 2012-06-10 DIAGNOSIS — G40909 Epilepsy, unspecified, not intractable, without status epilepticus: Secondary | ICD-10-CM | POA: Insufficient documentation

## 2012-06-10 DIAGNOSIS — Y9210 Unspecified residential institution as the place of occurrence of the external cause: Secondary | ICD-10-CM | POA: Insufficient documentation

## 2012-06-10 DIAGNOSIS — S0180XA Unspecified open wound of other part of head, initial encounter: Secondary | ICD-10-CM | POA: Insufficient documentation

## 2012-06-10 MED ORDER — BACITRACIN ZINC 500 UNIT/GM EX OINT
TOPICAL_OINTMENT | Freq: Two times a day (BID) | CUTANEOUS | Status: DC
Start: 2012-06-10 — End: 2012-07-10

## 2012-06-10 MED ORDER — NAPROXEN 500 MG PO TABS: 500.0000 mg | ORAL_TABLET | Freq: Two times a day (BID) | ORAL | Status: DC

## 2012-06-10 MED ORDER — LIDOCAINE HCL (PF) 1 % IJ SOLN
5.0000 mL | Freq: Once | INTRAMUSCULAR | Status: AC
  Administered 2012-06-10: 5 mL via INTRADERMAL
  Filled 2012-06-10: qty 5

## 2012-06-10 MED ORDER — TETANUS-DIPHTH-ACELL PERTUSSIS 5-2.5-18.5 LF-MCG/0.5 IM SUSP
0.5000 mL | Freq: Once | INTRAMUSCULAR | Status: AC
  Administered 2012-06-10: 0.5 mL via INTRAMUSCULAR
  Filled 2012-06-10: qty 0.5

## 2012-06-10 MED ORDER — BACITRACIN 500 UNIT/GM EX OINT
1.0000 "application " | TOPICAL_OINTMENT | Freq: Two times a day (BID) | CUTANEOUS | Status: DC
  Administered 2012-06-10: 1 via TOPICAL
  Filled 2012-06-10 (×5): qty 0.9

## 2012-06-10 NOTE — ED Notes (Signed)
Patient presents to ER via [facility] s/p fall with laceration noted to above right eye.

## 2012-06-10 NOTE — ED Provider Notes (Signed)
History     CSN: [id number]  Arrival date & time 06/10/12  4098   First MD Initiated Contact with Patient 06/10/12 0329      Chief Complaint  Patient presents with  . Fall  . Facial Laceration    (Consider location/radiation/quality/duration/timing/severity/associated sxs/prior treatment) HPI Comments: Level V caveat apply secondary to dementia  The patient has a significant history of seizures, hypertension, diabetes and presents this evening after having a fall and injuring the right fore head just above the right eyebrow. He states this occurred just prior to arrival. The staff at the nursing facility was unaware that he had fallen but his roommate had pulled the call bell light, when they checked on him he had an injury to his right forehead which he was cleaning at the sink with a wet paper towel when the nursing staff checked on him. The patient is unable to tell us why he fell or what he was doing but states that the only place he hurts is his head. According to the nursing staff he is at his baseline right now.  The history is provided by the patient, the EMS personnel and the nursing home. History Limited By: Dementia.    Past Medical History  Diagnosis Date  . Seizures   . Hypertension   . Diabetes mellitus   . Muscle weakness   . Dementia     History reviewed. No pertinent past surgical history.  No family history on file.  History  Substance Use Topics  . Smoking status: Not on file  . Smokeless tobacco: Not on file  . Alcohol Use: No      Review of Systems  Unable to perform ROS: Dementia    Allergies  Review of patient's allergies indicates no known allergies.  Home Medications   Current Outpatient Rx  Name  Route  Sig  Dispense  Refill  . ACETAMINOPHEN ER 650 MG PO TBCR   Oral   Take 650 mg by mouth every 4 (four) hours as needed.         . ASPIRIN 81 MG PO TABS   Oral   Take 81 mg by mouth daily.         Marland Kitchen DIVALPROEX SODIUM ER 250  MG PO TB24   Oral   Take 250 mg by mouth 3 (three) times daily.         Marland Kitchen FAMOTIDINE 20 MG PO TABS   Oral   Take 10 mg by mouth daily at 6 (six) AM.          . GLIPIZIDE 5 MG PO TABS   Oral   Take 5 mg by mouth daily at 6 (six) AM.          . GUAIFENESIN-DM 100-10 MG/5ML PO SYRP   Oral   Take 5 mLs by mouth 3 (three) times daily as needed.         Marland Kitchen LEVETIRACETAM 250 MG PO TABS   Oral   Take 250 mg by mouth 2 (two) times daily.         Marland Kitchen LORAZEPAM 0.5 MG PO TABS   Oral   Take 0.5 mg by mouth 4 (four) times daily -  before meals and at bedtime.         Marland Kitchen LORAZEPAM 1 MG PO TABS   Oral   Take 1 mg by mouth every 6 (six) hours as needed.         Marland Kitchen MAGNESIUM HYDROXIDE 400 MG/5ML  PO SUSP   Oral   Take 30 mLs by mouth daily as needed.         Marland Kitchen METFORMIN HCL 500 MG PO TABS   Oral   Take 500 mg by mouth 2 (two) times daily with a meal.         . MIRTAZAPINE 30 MG PO TABS   Oral   Take 30 mg by mouth at bedtime.         Marland Kitchen OXCARBAZEPINE 300 MG PO TABS   Oral   Take 300 mg by mouth 2 (two) times daily.         Marland Kitchen PHENYTOIN SODIUM EXTENDED 100 MG PO CAPS   Oral   Take 200 mg by mouth 2 (two) times daily.          Marland Kitchen PROMETHAZINE HCL 12.5 MG RE SUPP   Rectal   Place 12.5 mg rectally every 6 (six) hours as needed.         Marland Kitchen PROMETHAZINE HCL 12.5 MG PO TABS   Oral   Take 12.5 mg by mouth every 6 (six) hours as needed.         Bernadette Hoit SODIUM 8.6-50 MG PO TABS   Oral   Take 1 tablet by mouth daily.         Marland Kitchen BACITRACIN ZINC 500 UNIT/GM EX OINT   Topical   Apply topically 2 (two) times daily.   120 g   0   . CEPHALEXIN 500 MG PO CAPS      2 caps po bid x 7 days   28 capsule   0   . NAPROXEN 500 MG PO TABS   Oral   Take 1 tablet (500 mg total) by mouth 2 (two) times daily with a meal.   30 tablet   0   . NITROFURANTOIN MONOHYD MACRO 100 MG PO CAPS   Oral   Take 100 mg by mouth 2 (two) times daily.            BP 136/91  Pulse 93  Temp 97.8 F (36.6 C) (Oral)  Resp 16  Ht 6\' 2"  (1.88 m)  Wt 200 lb 3.2 oz (90.81 kg)  BMI 25.70 kg/m2  SpO2 98%  Physical Exam  Nursing note and vitals reviewed. Constitutional: He appears well-developed and well-nourished. No distress.  HENT:  Head: Normocephalic and atraumatic.  Mouth/Throat: Oropharynx is clear and moist. No oropharyngeal exudate.       5 cm jagged laceration to the right fore head above the right brow, no tenderness over the nasal bridge, no septal hematomas, no malocclusion, oropharynx is clear  Eyes: Conjunctivae normal and EOM are normal. Pupils are equal, round, and reactive to light. Right eye exhibits no discharge. Left eye exhibits no discharge. No scleral icterus.  Neck: No JVD present. No thyromegaly present.  Cardiovascular: Normal rate, regular rhythm, normal heart sounds and intact distal pulses.  Exam reveals no gallop and no friction rub.   No murmur heard. Pulmonary/Chest: Effort normal and breath sounds normal. No respiratory distress. He has no wheezes. He has no rales.  Abdominal: Soft. Bowel sounds are normal. He exhibits no distension and no mass. There is no tenderness.  Musculoskeletal: Normal range of motion. He exhibits no edema and no tenderness.  Lymphadenopathy:    He has no cervical adenopathy.  Neurological: He is alert.       Follows commands  Skin: Skin is warm and dry.  Laceration as outlined on forehead on head exam  Psychiatric: He has a normal mood and affect. His behavior is normal.    ED Course  Procedures (including critical care time)   Labs Reviewed  PHENYTOIN LEVEL, TOTAL  GLUCOSE, CAPILLARY   No results found.   1. Facial laceration   2. Fall       MDM  Focal injury to the right for him, check Dilantin level as this could make him ataxic causing the fall, check CBG, there is no bleeding from the wound, he is on a small aspirin dose but no other significant anticoagulants.  He is at baseline according to nursing home staff, we'll proceed with laceration repair.  Dilantin level normal, pt at baseline, stable for d/c.  LACERATION REPAIR Performed by: Vida Roller Authorized by: Vida Roller Consent: Verbal consent obtained. Risks and benefits: risks, benefits and alternatives were discussed Consent given by: patient Patient identity confirmed: provided demographic data Prepped and Draped in normal sterile fashion Wound explored  Laceration Location: R forehead  Laceration Length: 7.5 cm  No Foreign Bodies seen or palpated  Anesthesia: local infiltration  Local anesthetic: lidocaine 1% without epinephrine  Anesthetic total: 5 ml  Irrigation method: syringe Amount of cleaning: standard  Skin closure: 5-0 prolene  Number of sutures: 15  Technique: simple interrupted  Patient tolerance: Patient tolerated the procedure well with no immediate complications.       Vida Roller, MD 06/10/12 [phone]

## 2012-06-10 NOTE — ED Notes (Signed)
Pt returned to long term facility. Report called to facility.

## 2012-06-16 ENCOUNTER — Emergency Department (HOSPITAL_COMMUNITY)
Admission: EM | Admit: 2012-06-16 | Discharge: 2012-06-16 | Disposition: A | Discharge status: Home / Self Care | Payer: Medicaid Other | Attending: Emergency Medicine | Admitting: Emergency Medicine

## 2012-06-16 ENCOUNTER — Emergency Department (HOSPITAL_COMMUNITY): Payer: Medicaid Other

## 2012-06-16 ENCOUNTER — Encounter (HOSPITAL_COMMUNITY): Payer: Self-pay | Admitting: *Deleted

## 2012-06-16 DIAGNOSIS — F0390 Unspecified dementia without behavioral disturbance: Secondary | ICD-10-CM | POA: Insufficient documentation

## 2012-06-16 DIAGNOSIS — I10 Essential (primary) hypertension: Secondary | ICD-10-CM | POA: Insufficient documentation

## 2012-06-16 DIAGNOSIS — E119 Type 2 diabetes mellitus without complications: Secondary | ICD-10-CM | POA: Insufficient documentation

## 2012-06-16 DIAGNOSIS — M6281 Muscle weakness (generalized): Secondary | ICD-10-CM | POA: Insufficient documentation

## 2012-06-16 DIAGNOSIS — S51809A Unspecified open wound of unspecified forearm, initial encounter: Secondary | ICD-10-CM | POA: Insufficient documentation

## 2012-06-16 DIAGNOSIS — Y9389 Activity, other specified: Secondary | ICD-10-CM | POA: Insufficient documentation

## 2012-06-16 DIAGNOSIS — G40909 Epilepsy, unspecified, not intractable, without status epilepticus: Secondary | ICD-10-CM | POA: Insufficient documentation

## 2012-06-16 DIAGNOSIS — Z79899 Other long term (current) drug therapy: Secondary | ICD-10-CM | POA: Insufficient documentation

## 2012-06-16 DIAGNOSIS — W01119A Fall on same level from slipping, tripping and stumbling with subsequent striking against unspecified sharp object, initial encounter: Secondary | ICD-10-CM | POA: Insufficient documentation

## 2012-06-16 DIAGNOSIS — Z9181 History of falling: Secondary | ICD-10-CM | POA: Insufficient documentation

## 2012-06-16 DIAGNOSIS — Y9210 Unspecified residential institution as the place of occurrence of the external cause: Secondary | ICD-10-CM | POA: Insufficient documentation

## 2012-06-16 DIAGNOSIS — S51811A Laceration without foreign body of right forearm, initial encounter: Secondary | ICD-10-CM

## 2012-06-16 DIAGNOSIS — Z87828 Personal history of other (healed) physical injury and trauma: Secondary | ICD-10-CM | POA: Insufficient documentation

## 2012-06-16 MED ORDER — BACITRACIN ZINC 500 UNIT/GM EX OINT
TOPICAL_OINTMENT | CUTANEOUS | Status: AC
  Administered 2012-06-16: 1
  Filled 2012-06-16: qty 3.6

## 2012-06-16 MED ORDER — LIDOCAINE-EPINEPHRINE (PF) 2 %-1:200000 IJ SOLN
INTRAMUSCULAR | Status: AC
  Administered 2012-06-16: 20 mL
  Filled 2012-06-16: qty 20

## 2012-06-16 NOTE — ED Notes (Signed)
EMS here to transport pt, pt depends changed, placed in gown due to pants being wet. Report called to [facility], spoke with Tresa Endo, RN

## 2012-06-16 NOTE — ED Notes (Signed)
Pt arrived via ems from [facility] nursing [facility] d/t fall and laceration to right arm. Laceration is 6 in long.

## 2012-06-16 NOTE — ED Provider Notes (Signed)
History     CSN: [id number]  Arrival date & time 06/16/12  0407   First MD Initiated Contact with Patient 06/16/12 0408      Chief Complaint  Patient presents with  . 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 He presents after sustaining a laceration of his right upper extremity of the proximal forearm on the extensor surface. This occurred just prior to arrival, is not that occurred because his porcelain sink broke while he was leaning on it, he was found on the ground with a broken sink and a cut on his arm. The patient shakes his head no when he was asked if he fell and hit his head. The staff at the nursing facility and the patient's sister agreed that the patient has an unstable gait chronically and is not supposed to be walking but to use a wheelchair for transportation. He frequently does not obey this rule and gets up to try to walk. I saw the patient several days ago for similar fall where he fell and hit his for head requiring stitches. At that time his Dilantin level was therapeutic. The patient is not in any discomfort other than some mild pain to his arm. The patient is unable to speak to tell me exactly what happened.  The history is provided by the patient, the EMS personnel, the nursing home and medical records. History Limited By: brain injury, dementia, level V caveat apply.    Past Medical History  Diagnosis Date  . Seizures   . Hypertension   . Diabetes mellitus   . Muscle weakness   . Dementia     History reviewed. No pertinent past surgical history.  History reviewed. No pertinent family history.  History  Substance Use Topics  . Smoking status: Not on file  . Smokeless tobacco: Not on file  . Alcohol  Use: No      Review of Systems  Unable to perform ROS: Dementia    Allergies  Review of patient's allergies indicates no known allergies.  Home Medications   Current Outpatient Rx  Name  Route  Sig  Dispense  Refill  . ACETAMINOPHEN ER 650 MG PO TBCR   Oral   Take 650 mg by mouth every 4 (four) hours as needed.         . ASPIRIN 81 MG PO TABS   Oral   Take 81 mg by mouth daily.         Marland Kitchen BACITRACIN ZINC 500 UNIT/GM EX OINT   Topical   Apply topically 2 (two) times daily.   120 g   0   . DIVALPROEX SODIUM ER 250 MG PO TB24   Oral   Take 250 mg by mouth 3 (three) times daily.         Marland Kitchen FAMOTIDINE 20 MG PO TABS   Oral   Take 10 mg by mouth daily at 6 (six) AM.          . GLIPIZIDE 5 MG PO TABS   Oral   Take 5 mg by mouth daily at 6 (six) AM.          . LORAZEPAM 0.5 MG PO TABS   Oral   Take 0.5 mg by mouth 4 (four) times daily -  before meals and at bedtime.         Marland Kitchen  LORAZEPAM 1 MG PO TABS   Oral   Take 1 mg by mouth every 6 (six) hours as needed.         Marland Kitchen MAGNESIUM HYDROXIDE 400 MG/5ML PO SUSP   Oral   Take 30 mLs by mouth daily as needed.         Marland Kitchen MIRTAZAPINE 30 MG PO TABS   Oral   Take 30 mg by mouth at bedtime.         Marland Kitchen NITROFURANTOIN MONOHYD MACRO 100 MG PO CAPS   Oral   Take 100 mg by mouth 2 (two) times daily.         Marland Kitchen OXCARBAZEPINE 300 MG PO TABS   Oral   Take 300 mg by mouth 2 (two) times daily.         Marland Kitchen PHENYTOIN SODIUM EXTENDED 100 MG PO CAPS   Oral   Take 200 mg by mouth 2 (two) times daily.          Marland Kitchen PROMETHAZINE HCL 12.5 MG RE SUPP   Rectal   Place 12.5 mg rectally every 6 (six) hours as needed.         Marland Kitchen PROMETHAZINE HCL 12.5 MG PO TABS   Oral   Take 12.5 mg by mouth every 6 (six) hours as needed.         Bernadette Hoit SODIUM 8.6-50 MG PO TABS   Oral   Take 1 tablet by mouth daily.         . CEPHALEXIN 500 MG PO CAPS      2 caps po bid x 7 days   28 capsule   0   .  GUAIFENESIN-DM 100-10 MG/5ML PO SYRP   Oral   Take 5 mLs by mouth 3 (three) times daily as needed.         Marland Kitchen LEVETIRACETAM 250 MG PO TABS   Oral   Take 250 mg by mouth 2 (two) times daily.         Marland Kitchen METFORMIN HCL 500 MG PO TABS   Oral   Take 500 mg by mouth 2 (two) times daily with a meal.         . NAPROXEN 500 MG PO TABS   Oral   Take 1 tablet (500 mg total) by mouth 2 (two) times daily with a meal.   30 tablet   0     BP 139/88  Pulse 91  Temp 98.1 F (36.7 C)  Resp 16  SpO2 99%  Physical Exam  Nursing note and vitals reviewed. Constitutional: He appears well-developed and well-nourished. No distress.  HENT:  Head: Normocephalic and atraumatic.  Mouth/Throat: Oropharynx is clear and moist. No oropharyngeal exudate.       Healing wound to the right fore head, sutures intact, no drainage, no swelling.  Eyes: Conjunctivae normal and EOM are normal. Pupils are equal, round, and reactive to light. Right eye exhibits no discharge. Left eye exhibits no discharge. No scleral icterus.  Neck: Normal range of motion. Neck supple. No JVD present. No thyromegaly present.  Cardiovascular: Normal rate, regular rhythm, normal heart sounds and intact distal pulses.  Exam reveals no gallop and no friction rub.   No murmur heard. Pulmonary/Chest: Effort normal and breath sounds normal. No respiratory distress. He has no wheezes. He has no rales.  Abdominal: Soft. Bowel sounds are normal. He exhibits no distension and no mass. There is no tenderness.  Musculoskeletal: Normal range of motion. He exhibits tenderness (  large 12 cm laceration which is linear and deep penetrating the fascia of the forearm on the proximal right forearm on the extensor surface.). He exhibits no edema.  Lymphadenopathy:    He has no cervical adenopathy.  Neurological: He is alert. Coordination normal.  Skin: Skin is warm and dry.       Laceration as described  Psychiatric: He has a normal mood and affect.  His behavior is normal.    ED Course  Procedures (including critical care time)  Labs Reviewed - No data to display Dg Forearm Right  06/16/2012  *RADIOLOGY REPORT*  Clinical Data: Status post fall, with laceration to the right arm.  RIGHT FOREARM - 2 VIEW  Comparison: Right elbow radiographs performed 04/17/2007  Findings: There is no evidence of osseous disruption.  The radius and ulna appear intact.  The elbow joint is grossly unremarkable in appearance.  No elbow joint effusion is identified.  Soft tissue swelling is noted along the dorsum of the proximal forearm.  The carpal rows appear grossly intact, and demonstrate normal alignment.  Visualized joint spaces are preserved.  No radiopaque foreign bodies are identified.  IMPRESSION: No evidence of osseous disruption.  No radiopaque foreign bodies identified.   Original Report Authenticated By: Tonia Ghent, M.D.      1. Laceration of right forearm       MDM  The patient has a significant laceration to the right forearm. X-rays pending to rule out fracture. He required a very complicated and multilayer closure including fascia, muscular layers and the superficial skin layers. He is hemodynamically stable and does not appear to be in any other distress. He does have chronic weakness and inability to use his right upper extremity. His sister has arrived and states that he is at baseline for his functional capacity. I do not see any other signs of extremity injury, truncal injury or head injury. The patient appears stable at this time.  The wound was irrigated copiously with approximately 3 L of sterile saline, there was no foreign bodies in the wound, there is no indication for antibiotics at this time.  LACERATION REPAIR Performed by: Vida Roller Authorized by: Vida Roller Consent: Verbal consent obtained. Risks and benefits: risks, benefits and alternatives were discussed Consent given by: patient Patient identity confirmed:  provided demographic data Prepped and Draped in normal sterile fashion Wound explored  Laceration Location: Right forearm  Laceration Length: 12cm  No Foreign Bodies seen or palpated  Anesthesia: local infiltration  Local anesthetic: lidocaine 1% with epinephrine  Anesthetic total: 10 ml  Irrigation method: syringe Amount of cleaning: standard  Skin closure: 4-0 Prolene 12 sutures, horizontal mattress Muscular closure: This 4-0 Vicryl, running locking suture Fascial closure: 4-0 Vicryl, running locking suture  Number of sutures: 12  Technique: horizontal mattress  Patient tolerance: Patient tolerated the procedure well with no immediate complications.     I have personally interpreted the right forearm x-rays two-view. There is no obvious fractures or dislocations of the ulna, radius, elbow joint or wrist joint.    Vida Roller, MD 06/16/12 [phone]

## 2012-07-10 ENCOUNTER — Emergency Department (HOSPITAL_COMMUNITY): Payer: Medicaid Other

## 2012-07-10 ENCOUNTER — Encounter (HOSPITAL_COMMUNITY): Payer: Self-pay | Admitting: Emergency Medicine

## 2012-07-10 ENCOUNTER — Emergency Department (HOSPITAL_COMMUNITY)
Admission: EM | Admit: 2012-07-10 | Discharge: 2012-07-10 | Disposition: A | Discharge status: Home / Self Care | Payer: Medicaid Other | Attending: Emergency Medicine | Admitting: Emergency Medicine

## 2012-07-10 DIAGNOSIS — I10 Essential (primary) hypertension: Secondary | ICD-10-CM | POA: Insufficient documentation

## 2012-07-10 DIAGNOSIS — Z79899 Other long term (current) drug therapy: Secondary | ICD-10-CM | POA: Insufficient documentation

## 2012-07-10 DIAGNOSIS — F0390 Unspecified dementia without behavioral disturbance: Secondary | ICD-10-CM | POA: Insufficient documentation

## 2012-07-10 DIAGNOSIS — B349 Viral infection, unspecified: Secondary | ICD-10-CM

## 2012-07-10 DIAGNOSIS — E119 Type 2 diabetes mellitus without complications: Secondary | ICD-10-CM | POA: Insufficient documentation

## 2012-07-10 DIAGNOSIS — B9789 Other viral agents as the cause of diseases classified elsewhere: Secondary | ICD-10-CM | POA: Insufficient documentation

## 2012-07-10 DIAGNOSIS — G40909 Epilepsy, unspecified, not intractable, without status epilepticus: Secondary | ICD-10-CM | POA: Insufficient documentation

## 2012-07-10 DIAGNOSIS — R569 Unspecified convulsions: Secondary | ICD-10-CM

## 2012-07-10 DIAGNOSIS — R1110 Vomiting, unspecified: Secondary | ICD-10-CM | POA: Insufficient documentation

## 2012-07-10 DIAGNOSIS — Z8739 Personal history of other diseases of the musculoskeletal system and connective tissue: Secondary | ICD-10-CM | POA: Insufficient documentation

## 2012-07-10 LAB — CBC
HCT: 43 % (ref 39.0–52.0)
MCH: 30.8 pg (ref 26.0–34.0)
MCHC: 35.6 g/dL (ref 30.0–36.0)
MCV: 86.7 fL (ref 78.0–100.0)
RDW: 12.9 % (ref 11.5–15.5)

## 2012-07-10 LAB — URINE MICROSCOPIC-ADD ON

## 2012-07-10 LAB — BASIC METABOLIC PANEL
CO2: 26 mEq/L (ref 19–32)
Chloride: 94 mEq/L — ABNORMAL LOW (ref 96–112)
Creatinine, Ser: 1.24 mg/dL (ref 0.50–1.35)
Potassium: 3.8 mEq/L (ref 3.5–5.1)

## 2012-07-10 LAB — URINALYSIS, ROUTINE W REFLEX MICROSCOPIC
Glucose, UA: NEGATIVE mg/dL
Ketones, ur: NEGATIVE mg/dL
Leukocytes, UA: NEGATIVE
Specific Gravity, Urine: 1.025 (ref 1.005–1.030)
pH: 7 (ref 5.0–8.0)

## 2012-07-10 LAB — PHENYTOIN LEVEL, TOTAL: Phenytoin Lvl: 21.2 ug/mL — ABNORMAL HIGH (ref 10.0–20.0)

## 2012-07-10 LAB — LACTIC ACID, PLASMA: Lactic Acid, Venous: 1.7 mmol/L (ref 0.5–2.2)

## 2012-07-10 NOTE — ED Notes (Signed)
Pt will not respond verbally. Information on patient received from [facility] staff nurse.

## 2012-07-10 NOTE — ED Provider Notes (Signed)
History     CSN: [id number]  Arrival date & time 07/10/12  0725   First MD Initiated Contact with Patient 07/10/12 [phone]      Chief Complaint  Patient presents with  . Seizures    (Consider location/radiation/quality/duration/timing/severity/associated sxs/prior treatment) HPI Comments: [name] presents for evaluation of tonic/clonic seizure which occurred this morning around 5 AM, lasting less than 1 minute her nursing home RN at bedside.  He had an additional seizure yesterday evening also lasting about the same length of time.  He does have a history seizure disorder and takes Depakote and Dilantin for this condition, and has not missed any of his doses  Per the nursing record.   caregiver also expresses concern over patient's low-grade temperature  and he had 2 episodes of emesis one last night and one again this morning.  There has been no diarrhea, cough or changes in urination.  Patient is a nursing home patient with a history of dementia and  is unable to give to detailed history of his current condition.    He was given ativan prior to arrival,  And is now very sleepy which is his usual response to this medication.    The history is provided by the nursing home and a relative. The history is limited by the condition of the patient.    Past Medical History  Diagnosis Date  . Seizures   . Hypertension   . Diabetes mellitus   . Muscle weakness   . Dementia     History reviewed. No pertinent past surgical history.  History reviewed. No pertinent family history.  History  Substance Use Topics  . Smoking status: Not on file  . Smokeless tobacco: Not on file  . Alcohol Use: No      Review of Systems  Unable to perform ROS: Dementia    Allergies  Review of patient's allergies indicates no known allergies.  Home Medications   Current Outpatient Rx  Name  Route  Sig  Dispense  Refill  . ACETAMINOPHEN ER 650 MG PO TBCR   Oral   Take 650 mg by mouth every 4  (four) hours as needed.         . ASPIRIN 81 MG PO TABS   Oral   Take 81 mg by mouth daily.         Marland Kitchen DIVALPROEX SODIUM ER 250 MG PO TB24   Oral   Take 250 mg by mouth 3 (three) times daily.         Marland Kitchen FAMOTIDINE 20 MG PO TABS   Oral   Take 10 mg by mouth daily at 6 (six) AM.          . GLIPIZIDE 5 MG PO TABS   Oral   Take 5 mg by mouth daily at 6 (six) AM.          . LORAZEPAM 0.5 MG PO TABS   Oral   Take 0.5 mg by mouth 4 (four) times daily -  before meals and at bedtime.         Marland Kitchen LORAZEPAM 1 MG PO TABS   Oral   Take 1 mg by mouth every 6 (six) hours as needed.         Marland Kitchen MAGNESIUM HYDROXIDE 400 MG/5ML PO SUSP   Oral   Take 30 mLs by mouth daily as needed.         Marland Kitchen METFORMIN HCL 500 MG PO TABS   Oral  Take 500 mg by mouth 2 (two) times daily with a meal.         . MIRTAZAPINE 30 MG PO TABS   Oral   Take 30 mg by mouth at bedtime.         Marland Kitchen NAPROXEN 500 MG PO TABS   Oral   Take 1 tablet (500 mg total) by mouth 2 (two) times daily with a meal.   30 tablet   0   . OXCARBAZEPINE 300 MG PO TABS   Oral   Take 300 mg by mouth 2 (two) times daily.         Marland Kitchen PHENYTOIN SODIUM EXTENDED 100 MG PO CAPS   Oral   Take 200 mg by mouth 2 (two) times daily.          Marland Kitchen PROMETHAZINE HCL 12.5 MG RE SUPP   Rectal   Place 12.5 mg rectally every 6 (six) hours as needed.         Marland Kitchen PROMETHAZINE HCL 12.5 MG PO TABS   Oral   Take 12.5 mg by mouth every 6 (six) hours as needed.         Bernadette Hoit SODIUM 8.6-50 MG PO TABS   Oral   Take 1 tablet by mouth daily.           BP 134/84  Pulse 116  Temp 100.7 F (38.2 C) (Rectal)  Resp 19  Ht 5\' 9"  (1.753 m)  Wt 215 lb (97.523 kg)  BMI 31.75 kg/m2  SpO2 95%  Physical Exam  Nursing note and vitals reviewed. Constitutional: He appears well-developed and well-nourished. He is sleeping. He is easily aroused. He does not have a sickly appearance. He does not appear ill.  HENT:  Head:  Normocephalic and atraumatic.  Eyes: Conjunctivae normal are normal.  Neck: Normal range of motion.  Cardiovascular: Regular rhythm, normal heart sounds, intact distal pulses and normal pulses.  Tachycardia present.   Pulmonary/Chest: Effort normal and breath sounds normal. No respiratory distress. He has no wheezes. He has no rales.  Abdominal: Soft. Bowel sounds are normal. He exhibits distension. He exhibits no mass. There is no tenderness. There is no rebound and no guarding.  Musculoskeletal: Normal range of motion. He exhibits no edema and no tenderness.  Neurological: He is easily aroused. He exhibits normal muscle tone. He displays no seizure activity.       Patient sleeping,  Easily arousable, not cooperative for meaningful neuro exam, grossly no deficits noted.  Skin: Skin is warm and dry. No rash noted.  Psychiatric: He has a normal mood and affect.    ED Course  Procedures (including critical care time)  Labs Reviewed  URINALYSIS, ROUTINE W REFLEX MICROSCOPIC - Abnormal; Notable for the following:    Protein, ur 100 (*)     All other components within normal limits  BASIC METABOLIC PANEL - Abnormal; Notable for the following:    Chloride 94 (*)     Glucose, Bld 125 (*)     GFR calc non Af Amer 62 (*)     GFR calc Af Amer 72 (*)     All other components within normal limits  VALPROIC ACID LEVEL - Abnormal; Notable for the following:    Valproic Acid Lvl 10.1 (*)     All other components within normal limits  PHENYTOIN LEVEL, TOTAL - Abnormal; Notable for the following:    Phenytoin Lvl 21.2 (*)     All other components within normal limits  CBC - Abnormal; Notable for the following:    WBC 21.4 (*)     All other components within normal limits  URINE MICROSCOPIC-ADD ON - Abnormal; Notable for the following:    Casts HYALINE CASTS (*)     All other components within normal limits  LACTIC ACID, PLASMA  CULTURE, BLOOD (ROUTINE X 2)  CULTURE, BLOOD (ROUTINE X 2)  URINE  CULTURE   Dg Chest 1 View  07/10/2012  *RADIOLOGY REPORT*  Clinical Data: Fever, seizures, vomiting  CHEST - 1 VIEW  Comparison: Portable chest x-ray of 01/08/2010  Findings: The lungs are clear.  Mediastinal contours are stable. The heart is within upper limits normal.  No bony abnormality is seen.  IMPRESSION: Stable chest x-ray.  No active lung disease.   Original Report Authenticated By: Dwyane Dee, M.D.    Ct Head Wo Contrast  07/10/2012  *RADIOLOGY REPORT*  Clinical Data: Seizures.  CT HEAD WITHOUT CONTRAST  Technique:  Contiguous axial images were obtained from the base of the skull through the vertex without contrast.  Comparison: CT head without contrast 05/04/2012.  Next the  Findings: No extensive encephalomalacia of the left frontal lobe is stable.  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  No acute cortical infarct, hemorrhage, or mass lesion is present. The ventricles are proportionate to the degree of atrophy.  No significant extra-axial fluid collection is present.  The paranasal sinuses are clear.  Chronic mastoid effusions are stable.  The osseous skull is otherwise intact.  IMPRESSION:  1. 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 2.  Chronic bilateral mastoid effusions. 3.  No acute intracranial abnormality or significant interval change.   Original Report Authenticated By: Marin Roberts, M.D.      1. Viral syndrome   2. Seizure disorder   3. Seizure secondary to subtherapeutic anticonvulsant medication       MDM  Patient with known seizure disorder with brief seizure x 2 along with fever since yesterday. Suspect viral illness.  Labs and xrays reviewed - no obvious source of leukocytosis found,  Blood cx pending,  Initial results negative.   He spent the majority of time sleeping,  Although woke for sister several times during his stay here.  RN from [facility] stated pt often will use avoidance techniques to avoid confrontation.  Prior to dc home,   Attempted to get him to "wake" which he refused to open eyes, but when asked questions, he would answer with nods of his head.  Seen by Dr Deretha Emory prior to dc home.  Will be sent back to skilled facility,  Instructed to increase depakote for today, further guidance re subtherapeutic depakote level per pcp.  In the interim,  Return here for any worsened sx.   Date: 07/10/2012  Rate: 117  Rhythm: sinus tachycardia  QRS Axis: normal  Intervals: normal and QT prolonged  ST/T Wave abnormalities: normal  Conduction Disutrbances:left anterior fascicular block  Narrative Interpretation:   Old EKG Reviewed: unchanged       Burgess Amor, PA 07/10/12 1652

## 2012-07-10 NOTE — ED Notes (Signed)
ems reported pt had tonic/clonic seizure this am at nursing home. Pt has hx of seizures per NH.

## 2012-07-10 NOTE — ED Provider Notes (Signed)
Medical screening examination/treatment/procedure(s) were conducted as a shared visit with non-physician practitioner(s) and myself.  I personally evaluated the patient during the encounter  Patient seen by me. Patient with known history of seizures family members present states that normally when he gets Ativan he often sleeps for long periods of time. Patient seems to be back to somewhat of his baseline. Workup without any significant abnormalities there was a low-grade fever 100.7. No evidence of pneumonia head CT was negative antiseizure medicines levels checked one was low one was high will give a extra oral dose of the low antiseizure medicine. Patient most likely stable to return to nursing home family members believe that is the case.   Initially were very concerned about the significant leukocytosis. However workup for a source of infection is not present in the urine not present in the chest x-ray and not present on physical examination.    Results for orders placed during the hospital encounter of 07/10/12  URINALYSIS, ROUTINE W REFLEX MICROSCOPIC      Component Value Range   Color, Urine YELLOW  YELLOW   APPearance CLEAR  CLEAR   Specific Gravity, Urine 1.025  1.005 - 1.030   pH 7.0  5.0 - 8.0   Glucose, UA NEGATIVE  NEGATIVE mg/dL   Hgb urine dipstick NEGATIVE  NEGATIVE   Bilirubin Urine NEGATIVE  NEGATIVE   Ketones, ur NEGATIVE  NEGATIVE mg/dL   Protein, ur 454 (*) NEGATIVE mg/dL   Urobilinogen, UA 0.2  0.0 - 1.0 mg/dL   Nitrite NEGATIVE  NEGATIVE   Leukocytes, UA NEGATIVE  NEGATIVE  BASIC METABOLIC PANEL      Component Value Range   Sodium 136  135 - 145 mEq/L   Potassium 3.8  3.5 - 5.1 mEq/L   Chloride 94 (*) 96 - 112 mEq/L   CO2 26  19 - 32 mEq/L   Glucose, Bld 125 (*) 70 - 99 mg/dL   BUN 21  6 - 23 mg/dL   Creatinine, Ser 0.98  0.50 - 1.35 mg/dL   Calcium 9.1  8.4 - 11.9 mg/dL   GFR calc non Af Amer 62 (*) >90 mL/min   GFR calc Af Amer 72 (*) >90 mL/min  VALPROIC  ACID LEVEL      Component Value Range   Valproic Acid Lvl 10.1 (*) 50.0 - 100.0 ug/mL  PHENYTOIN LEVEL, TOTAL      Component Value Range   Phenytoin Lvl 21.2 (*) 10.0 - 20.0 ug/mL  CBC      Component Value Range   WBC 21.4 (*) 4.0 - 10.5 K/uL   RBC 4.96  4.22 - 5.81 MIL/uL   Hemoglobin 15.3  13.0 - 17.0 g/dL   HCT 14.7  82.9 - 56.2 %   MCV 86.7  78.0 - 100.0 fL   MCH 30.8  26.0 - 34.0 pg   MCHC 35.6  30.0 - 36.0 g/dL   RDW 13.0  86.5 - 78.4 %   Platelets 281  150 - 400 K/uL  URINE MICROSCOPIC-ADD ON      Component Value Range   Squamous Epithelial / LPF RARE  RARE   WBC, UA 0-2  <3 WBC/hpf   RBC / HPF 3-6  <3 RBC/hpf   Bacteria, UA RARE  RARE   Casts HYALINE CASTS (*) NEGATIVE  LACTIC ACID, PLASMA      Component Value Range   Lactic Acid, Venous 1.7  0.5 - 2.2 mmol/L  CULTURE, BLOOD (ROUTINE X 2)  Component Value Range   Specimen Description Blood RIGHT HAND     Special Requests BOTTLES DRAWN AEROBIC AND ANAEROBIC 6 CC EACH     Culture NO GROWTH <24 HRS     Report Status PENDING    CULTURE, BLOOD (ROUTINE X 2)      Component Value Range   Specimen Description Blood RIGHT ARM     Special Requests BOTTLES DRAWN AEROBIC AND ANAEROBIC 6 CC EACH     Culture NO GROWTH <24 HRS     Report Status PENDING     Dg Chest 1 View  07/10/2012  *RADIOLOGY REPORT*  Clinical Data: Fever, seizures, vomiting  CHEST - 1 VIEW  Comparison: Portable chest x-ray of 01/08/2010  Findings: The lungs are clear.  Mediastinal contours are stable. The heart is within upper limits normal.  No bony abnormality is seen.  IMPRESSION: Stable chest x-ray.  No active lung disease.   Original Report Authenticated By: Dwyane Dee, M.D.    Dg Forearm Right  06/16/2012  *RADIOLOGY REPORT*  Clinical Data: Status post fall, with laceration to the right arm.  RIGHT FOREARM - 2 VIEW  Comparison: Right elbow radiographs performed 04/17/2007  Findings: There is no evidence of osseous disruption.  The radius and ulna  appear intact.  The elbow joint is grossly unremarkable in appearance.  No elbow joint effusion is identified.  Soft tissue swelling is noted along the dorsum of the proximal forearm.  The carpal rows appear grossly intact, and demonstrate normal alignment.  Visualized joint spaces are preserved.  No radiopaque foreign bodies are identified.  IMPRESSION: No evidence of osseous disruption.  No radiopaque foreign bodies identified.   Original Report Authenticated By: Tonia Ghent, M.D.    Ct Head Wo Contrast  07/10/2012  *RADIOLOGY REPORT*  Clinical Data: Seizures.  CT HEAD WITHOUT CONTRAST  Technique:  Contiguous axial images were obtained from the base of the skull through the vertex without contrast.  Comparison: CT head without contrast 05/04/2012.  Next the  Findings: No extensive encephalomalacia of the left frontal lobe is stable.  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  No acute cortical infarct, hemorrhage, or mass lesion is present. The ventricles are proportionate to the degree of atrophy.  No significant extra-axial fluid collection is present.  The paranasal sinuses are clear.  Chronic mastoid effusions are stable.  The osseous skull is otherwise intact.  IMPRESSION:  1. 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 2.  Chronic bilateral mastoid effusions. 3.  No acute intracranial abnormality or significant interval change.   Original Report Authenticated By: Marin Roberts, M.D.       Shelda Jakes, MD 07/10/12 [phone]

## 2012-07-11 LAB — URINE CULTURE
Colony Count: NO GROWTH
Culture: NO GROWTH

## 2012-07-15 LAB — CULTURE, BLOOD (ROUTINE X 2): Culture: NO GROWTH

## 2012-07-21 ENCOUNTER — Encounter (HOSPITAL_COMMUNITY): Payer: Self-pay

## 2012-07-21 ENCOUNTER — Emergency Department (HOSPITAL_COMMUNITY)
Admission: EM | Admit: 2012-07-21 | Discharge: 2012-07-21 | Disposition: A | Discharge status: Home / Self Care | Payer: Medicaid Other | Attending: Emergency Medicine | Admitting: Emergency Medicine

## 2012-07-21 ENCOUNTER — Emergency Department (HOSPITAL_COMMUNITY): Payer: Medicaid Other

## 2012-07-21 DIAGNOSIS — F0390 Unspecified dementia without behavioral disturbance: Secondary | ICD-10-CM | POA: Insufficient documentation

## 2012-07-21 DIAGNOSIS — E119 Type 2 diabetes mellitus without complications: Secondary | ICD-10-CM | POA: Insufficient documentation

## 2012-07-21 DIAGNOSIS — I10 Essential (primary) hypertension: Secondary | ICD-10-CM | POA: Insufficient documentation

## 2012-07-21 DIAGNOSIS — J40 Bronchitis, not specified as acute or chronic: Secondary | ICD-10-CM

## 2012-07-21 DIAGNOSIS — J209 Acute bronchitis, unspecified: Secondary | ICD-10-CM | POA: Insufficient documentation

## 2012-07-21 DIAGNOSIS — Z7982 Long term (current) use of aspirin: Secondary | ICD-10-CM | POA: Insufficient documentation

## 2012-07-21 DIAGNOSIS — R509 Fever, unspecified: Secondary | ICD-10-CM | POA: Insufficient documentation

## 2012-07-21 DIAGNOSIS — D72828 Other elevated white blood cell count: Secondary | ICD-10-CM | POA: Insufficient documentation

## 2012-07-21 DIAGNOSIS — Z791 Long term (current) use of non-steroidal anti-inflammatories (NSAID): Secondary | ICD-10-CM | POA: Insufficient documentation

## 2012-07-21 DIAGNOSIS — R569 Unspecified convulsions: Secondary | ICD-10-CM

## 2012-07-21 DIAGNOSIS — F4489 Other dissociative and conversion disorders: Secondary | ICD-10-CM | POA: Insufficient documentation

## 2012-07-21 DIAGNOSIS — Z79899 Other long term (current) drug therapy: Secondary | ICD-10-CM | POA: Insufficient documentation

## 2012-07-21 DIAGNOSIS — G40909 Epilepsy, unspecified, not intractable, without status epilepticus: Secondary | ICD-10-CM | POA: Insufficient documentation

## 2012-07-21 DIAGNOSIS — Z8669 Personal history of other diseases of the nervous system and sense organs: Secondary | ICD-10-CM | POA: Insufficient documentation

## 2012-07-21 LAB — CBC WITH DIFFERENTIAL/PLATELET
Basophils Absolute: 0.1 10*3/uL (ref 0.0–0.1)
HCT: 44.5 % (ref 39.0–52.0)
Lymphocytes Relative: 23 % (ref 12–46)
Neutro Abs: 5.6 10*3/uL (ref 1.7–7.7)
Platelets: 331 10*3/uL (ref 150–400)
RBC: 4.96 MIL/uL (ref 4.22–5.81)
RDW: 13 % (ref 11.5–15.5)
WBC: 8.6 10*3/uL (ref 4.0–10.5)

## 2012-07-21 LAB — COMPREHENSIVE METABOLIC PANEL
ALT: 14 U/L (ref 0–53)
AST: 15 U/L (ref 0–37)
Alkaline Phosphatase: 198 U/L — ABNORMAL HIGH (ref 39–117)
CO2: 26 mEq/L (ref 19–32)
Chloride: 102 mEq/L (ref 96–112)
GFR calc non Af Amer: 60 mL/min — ABNORMAL LOW (ref >90)
Potassium: 4.2 mEq/L (ref 3.5–5.1)
Sodium: 142 mEq/L (ref 135–145)
Total Bilirubin: 0.3 mg/dL (ref 0.3–1.2)

## 2012-07-21 LAB — VALPROIC ACID LEVEL: Valproic Acid Lvl: 36.6 ug/mL — ABNORMAL LOW (ref 50.0–100.0)

## 2012-07-21 MED ORDER — DIVALPROEX SODIUM 250 MG PO DR TAB
500.0000 mg | DELAYED_RELEASE_TABLET | Freq: Two times a day (BID) | ORAL | Status: DC
  Administered 2012-07-21: 500 mg via ORAL
  Filled 2012-07-21: qty 2

## 2012-07-21 MED ORDER — AZITHROMYCIN 250 MG PO TABS: ORAL_TABLET | ORAL | Status: DC

## 2012-07-21 MED ORDER — SODIUM CHLORIDE 0.9 % IV SOLN: INTRAVENOUS | Status: DC

## 2012-07-21 NOTE — ED Notes (Signed)
Pt discharged. Pt stable at time of discharge. Medications reviewed pt has no questions regarding discharge at this time. Pt voiced understanding of discharge instructions.  

## 2012-07-21 NOTE — ED Notes (Signed)
Pt sent here from [facility] for eval of seizure and altered mental status. Nurse states pt normally walks and talks and is alert. States he was recently started on Rocephin for elevated white count.

## 2012-07-21 NOTE — ED Provider Notes (Addendum)
History     CSN: [id number]  Arrival date & time 07/21/12  0865   First MD Initiated Contact with Patient 07/21/12 1842      Chief Complaint  Patient presents with  . Altered Mental Status    (Consider location/radiation/quality/duration/timing/severity/associated sxs/prior treatment) HPI Comments: Patient comes to the ER for evaluation of seizure and mental status changes. Patient reportedly had a generalized tonic-clonic seizure earlier today at the skilled nursing facility. Patient has reportedly been less responsive than usual since the seizure. Patient normally is awake, alert and ambulatory.  Patient was seen in the emergency department a little more than a week ago for low-grade fever. It was felt it was viral in nature at that time. Since leaving the ER, however, patient reportedly continued to have fever and had an elevated white count, was recently started on Rocephin.  Patient is a 62 y.o. male presenting with altered mental status.  Altered Mental Status    Past Medical History  Diagnosis Date  . Seizures   . Hypertension   . Diabetes mellitus   . Muscle weakness   . Dementia     History reviewed. No pertinent past surgical history.  No family history on file.  History  Substance Use Topics  . Smoking status: Not on file  . Smokeless tobacco: Not on file  . Alcohol Use: No      Review of Systems  Unable to perform ROS Constitutional: Positive for fever.  Neurological: Positive for seizures.  Psychiatric/Behavioral: Positive for confusion and altered mental status.    Allergies  Review of patient's allergies indicates no known allergies.  Home Medications   Current Outpatient Rx  Name  Route  Sig  Dispense  Refill  . ACETAMINOPHEN ER 650 MG PO TBCR   Oral   Take 650 mg by mouth every 4 (four) hours as needed.         . ASPIRIN 81 MG PO TABS   Oral   Take 81 mg by mouth daily.         Marland Kitchen DIVALPROEX SODIUM ER 250 MG PO TB24   Oral  Take 250 mg by mouth 3 (three) times daily.         Marland Kitchen FAMOTIDINE 20 MG PO TABS   Oral   Take 10 mg by mouth daily at 6 (six) AM.          . GLIPIZIDE 5 MG PO TABS   Oral   Take 5 mg by mouth daily at 6 (six) AM.          . LORAZEPAM 0.5 MG PO TABS   Oral   Take 0.5 mg by mouth 4 (four) times daily -  before meals and at bedtime.         Marland Kitchen LORAZEPAM 1 MG PO TABS   Oral   Take 1 mg by mouth every 6 (six) hours as needed.         Marland Kitchen MAGNESIUM HYDROXIDE 400 MG/5ML PO SUSP   Oral   Take 30 mLs by mouth daily as needed.         Marland Kitchen METFORMIN HCL 500 MG PO TABS   Oral   Take 500 mg by mouth 2 (two) times daily with a meal.         . MIRTAZAPINE 30 MG PO TABS   Oral   Take 30 mg by mouth at bedtime.         Marland Kitchen NAPROXEN 500 MG PO TABS  Oral   Take 1 tablet (500 mg total) by mouth 2 (two) times daily with a meal.   30 tablet   0   . OXCARBAZEPINE 300 MG PO TABS   Oral   Take 300 mg by mouth 2 (two) times daily.         Marland Kitchen PHENYTOIN SODIUM EXTENDED 100 MG PO CAPS   Oral   Take 200 mg by mouth 2 (two) times daily.          Marland Kitchen PROMETHAZINE HCL 12.5 MG RE SUPP   Rectal   Place 12.5 mg rectally every 6 (six) hours as needed.         Marland Kitchen PROMETHAZINE HCL 12.5 MG PO TABS   Oral   Take 12.5 mg by mouth every 6 (six) hours as needed.         Bernadette Hoit SODIUM 8.6-50 MG PO TABS   Oral   Take 1 tablet by mouth daily.           BP 124/87  Pulse 100  Temp 97.9 F (36.6 C) (Rectal)  Resp 16  SpO2 99%  Physical Exam  Constitutional: He appears well-developed and well-nourished.  HENT:  Head: Normocephalic.  Eyes: Conjunctivae normal are normal. Pupils are equal, round, and reactive to light.  Neck: Normal range of motion.  Cardiovascular: Normal rate, regular rhythm and normal heart sounds.   Pulmonary/Chest: Breath sounds normal. No respiratory distress.  Abdominal: Soft. Bowel sounds are normal. He exhibits no distension.    Musculoskeletal: He exhibits no edema.  Neurological: He is unresponsive. No cranial nerve deficit.    ED Course  Procedures (including critical care time)  Labs Reviewed  COMPREHENSIVE METABOLIC PANEL - Abnormal; Notable for the following:    Alkaline Phosphatase 198 (*)     GFR calc non Af Amer 60 (*)     GFR calc Af Amer 70 (*)     All other components within normal limits  VALPROIC ACID LEVEL - Abnormal; Notable for the following:    Valproic Acid Lvl 36.6 (*)     All other components within normal limits  CBC WITH DIFFERENTIAL  TROPONIN I  PHENYTOIN LEVEL, TOTAL  GLUCOSE, CAPILLARY  URINALYSIS, ROUTINE W REFLEX MICROSCOPIC   Ct Head Wo Contrast  07/21/2012  *RADIOLOGY REPORT*  Clinical Data: Seizure, altered level of consciousness.  CT HEAD WITHOUT CONTRAST  Technique:  Contiguous axial images were obtained from the base of the skull through the vertex without contrast.  Comparison: 07/10/2012 most recent.  The oldest exam is 05/23/2003.  Findings: There is no evidence for acute infarction, intracranial hemorrhage, mass lesion, hydrocephalus, or extra-axial fluid. There is moderate premature atrophy with chronic microvascular ischemic change.  There is extensive encephalomalacia left frontal lobe also affecting the left temporal lobe. Moderate right temporal tip atrophy/encephalomalacia.  Remote right cerebellar infarct. Calvarium intact.  No acute sinus disease.  Chronic bilateral mastoid fluid. No change from most recent priors.  IMPRESSION: Stable exam.  No acute intracranial findings.   Original Report Authenticated By: Davonna Belling, M.D.      1. Seizure   2. Bronchitis       MDM  Patient comes to the ER for evaluation of the seizure and confusion. Patient arrived in the ER with postictal state. He had had a generalized tonic-clonic seizure. Records reviewed and he has had several recently. Most recently it was felt that his Dilantin level was low and his dose was  increased. His Dilantin level  is now therapeutic today, but Depakote level is low. Patient will have Depakote augmented here in the ER. He was observed for a period of time and is now awake and alert, his normal baseline. All of his workup was negative for infection or other causitive findings.  Family tells me that he has had a cough for a week. Will add zithromax.        Gilda Crease, MD 07/21/12 2144     Date: 07/21/2012  Rate: 100  Rhythm: normal sinus rhythm  QRS Axis: left  Intervals: normal  ST/T Wave abnormalities: nonspecific ST/T changes  Conduction Disutrbances:left anterior fascicular block  Narrative Interpretation:   Old EKG Reviewed: unchanged   Gilda Crease, MD 07/21/12 2151

## 2012-08-11 ENCOUNTER — Encounter (HOSPITAL_COMMUNITY): Payer: Self-pay | Admitting: *Deleted

## 2012-08-11 ENCOUNTER — Emergency Department (HOSPITAL_COMMUNITY)
Admission: EM | Admit: 2012-08-11 | Discharge: 2012-08-11 | Disposition: A | Discharge status: Home / Self Care | Payer: Medicaid Other | Attending: Emergency Medicine | Admitting: Emergency Medicine

## 2012-08-11 ENCOUNTER — Emergency Department (HOSPITAL_COMMUNITY): Payer: Medicaid Other

## 2012-08-11 DIAGNOSIS — E119 Type 2 diabetes mellitus without complications: Secondary | ICD-10-CM | POA: Insufficient documentation

## 2012-08-11 DIAGNOSIS — F0390 Unspecified dementia without behavioral disturbance: Secondary | ICD-10-CM | POA: Insufficient documentation

## 2012-08-11 DIAGNOSIS — W050XXA Fall from non-moving wheelchair, initial encounter: Secondary | ICD-10-CM | POA: Insufficient documentation

## 2012-08-11 DIAGNOSIS — Z79899 Other long term (current) drug therapy: Secondary | ICD-10-CM | POA: Insufficient documentation

## 2012-08-11 DIAGNOSIS — I10 Essential (primary) hypertension: Secondary | ICD-10-CM | POA: Insufficient documentation

## 2012-08-11 DIAGNOSIS — G40909 Epilepsy, unspecified, not intractable, without status epilepticus: Secondary | ICD-10-CM | POA: Insufficient documentation

## 2012-08-11 DIAGNOSIS — Z8739 Personal history of other diseases of the musculoskeletal system and connective tissue: Secondary | ICD-10-CM | POA: Insufficient documentation

## 2012-08-11 DIAGNOSIS — S0180XA Unspecified open wound of other part of head, initial encounter: Secondary | ICD-10-CM | POA: Insufficient documentation

## 2012-08-11 DIAGNOSIS — S0181XA Laceration without foreign body of other part of head, initial encounter: Secondary | ICD-10-CM

## 2012-08-11 DIAGNOSIS — Z7982 Long term (current) use of aspirin: Secondary | ICD-10-CM | POA: Insufficient documentation

## 2012-08-11 DIAGNOSIS — Y929 Unspecified place or not applicable: Secondary | ICD-10-CM | POA: Insufficient documentation

## 2012-08-11 DIAGNOSIS — W19XXXA Unspecified fall, initial encounter: Secondary | ICD-10-CM

## 2012-08-11 DIAGNOSIS — Y9389 Activity, other specified: Secondary | ICD-10-CM | POA: Insufficient documentation

## 2012-08-11 DIAGNOSIS — N390 Urinary tract infection, site not specified: Secondary | ICD-10-CM

## 2012-08-11 LAB — BASIC METABOLIC PANEL
BUN: 24 mg/dL — ABNORMAL HIGH (ref 6–23)
GFR calc non Af Amer: 59 mL/min — ABNORMAL LOW (ref >90)
Glucose, Bld: 113 mg/dL — ABNORMAL HIGH (ref 70–99)
Potassium: 3.5 mEq/L (ref 3.5–5.1)

## 2012-08-11 LAB — URINALYSIS, ROUTINE W REFLEX MICROSCOPIC
Hgb urine dipstick: NEGATIVE
Specific Gravity, Urine: >1.03 — ABNORMAL HIGH (ref 1.005–1.030)
Urobilinogen, UA: 0.2 mg/dL (ref 0.0–1.0)
pH: 6 (ref 5.0–8.0)

## 2012-08-11 LAB — CBC WITH DIFFERENTIAL/PLATELET
Eosinophils Absolute: 0.4 10*3/uL (ref 0.0–0.7)
Hemoglobin: 16.3 g/dL (ref 13.0–17.0)
Lymphs Abs: 2 10*3/uL (ref 0.7–4.0)
MCH: 31.3 pg (ref 26.0–34.0)
MCV: 88.8 fL (ref 78.0–100.0)
Monocytes Relative: 11 % (ref 3–12)
Neutrophils Relative %: 64 % (ref 43–77)
RBC: 5.2 MIL/uL (ref 4.22–5.81)

## 2012-08-11 MED ORDER — CEPHALEXIN 500 MG PO CAPS: 500.0000 mg | ORAL_CAPSULE | Freq: Four times a day (QID) | ORAL | Status: DC

## 2012-08-11 MED ORDER — LIDOCAINE HCL (PF) 2 % IJ SOLN
INTRAMUSCULAR | Status: AC
  Filled 2012-08-11: qty 10

## 2012-08-11 MED ORDER — LIDOCAINE HCL (PF) 2 % IJ SOLN
INTRAMUSCULAR | Status: AC
  Administered 2012-08-11: 10 mL
  Filled 2012-08-11: qty 10

## 2012-08-11 NOTE — ED Notes (Signed)
Pt was found by staff at [facility] in floor with laceration to left forehead area above left eyebrow. Pt will not talk with staff upon arrival to er, staff at [facility] unsure of what caused him to fall and also report that pt will either be combative with staff or refuse to talk to staff. Sister at bedside,

## 2012-08-11 NOTE — ED Notes (Signed)
Pt now talking with sister in room,

## 2012-08-11 NOTE — ED Provider Notes (Signed)
History   This chart was scribed for Flint Melter, MD by Leone Payor, ED Scribe. This patient was seen in room [facility] and the patient's care was started 6:20 PM.   CSN: [id number]  Arrival date & time 08/11/12  1735   None     Chief Complaint  Patient presents with  . Fall  . Head Laceration     The history is provided by a relative. The history is limited by the condition of the patient. No language interpreter was used.   Level 5 Caveat-Dementia  [name] is a 62 y.o. male who presents to the Emergency Department complaining of new, constant, unchanged head laceration to the left forehead above left eyebrow starting PTA. Pt fell off of his wheelchair and was found on the floor by staff at [facility]. Staff at [facility] is unsure of what caused him to fall and also report that pt will either be combative with staff or refuse to talk to staff. Pt will not talk with staff upon arrival to ER.    Pt has h/o seizures, HTN, DM, dementia, muscle weakness.  Past Medical History  Diagnosis Date  . Seizures   . Hypertension   . Diabetes mellitus   . Muscle weakness   . Dementia     History reviewed. No pertinent past surgical history.  No family history on file.  History  Substance Use Topics  . Smoking status: Not on file  . Smokeless tobacco: Not on file  . Alcohol Use: No      Review of Systems  Level 5 Caveat-Dementia  Allergies  Review of patient's allergies indicates no known allergies.  Home Medications   Current Outpatient Rx  Name  Route  Sig  Dispense  Refill  . ASPIRIN 81 MG PO TABS   Oral   Take 81 mg by mouth daily.         Marland Kitchen DIVALPROEX SODIUM ER 500 MG PO TB24   Oral   Take 500 mg by mouth every 8 (eight) hours. Given at 600,1400,2200         . FAMOTIDINE 20 MG PO TABS   Oral   Take 10 mg by mouth daily at 6 (six) AM.          . GLIPIZIDE 5 MG PO TABS   Oral   Take 5 mg by mouth daily at 6 (six) AM.          . LORAZEPAM 0.5 MG  PO TABS   Oral   Take 0.5 mg by mouth every 8 (eight) hours. For epilepsy given at 0600,1400,2200         . METFORMIN HCL 500 MG PO TABS   Oral   Take 500 mg by mouth 2 (two) times daily with a meal.         . MIRTAZAPINE 30 MG PO TABS   Oral   Take 30 mg by mouth at bedtime.         Marland Kitchen OXCARBAZEPINE 300 MG PO TABS   Oral   Take 300 mg by mouth 2 (two) times daily.         Marland Kitchen PHENYTOIN SODIUM EXTENDED 100 MG PO CAPS   Oral   Take 200 mg by mouth 2 (two) times daily.          Bernadette Hoit SODIUM 8.6-50 MG PO TABS   Oral   Take 1 tablet by mouth daily.         Marland Kitchen  SERTRALINE HCL 50 MG PO TABS   Oral   Take 50 mg by mouth daily.         . ACETAMINOPHEN ER 650 MG PO TBCR   Oral   Take 650 mg by mouth every 4 (four) hours as needed. pain         . CEPHALEXIN 500 MG PO CAPS   Oral   Take 1 capsule (500 mg total) by mouth 4 (four) times daily.   28 capsule   0   . MAGNESIUM HYDROXIDE 400 MG/5ML PO SUSP   Oral   Take 30 mLs by mouth daily as needed. constipation         . PROMETHAZINE HCL 12.5 MG RE SUPP   Rectal   Place 12.5 mg rectally every 6 (six) hours as needed. nausea         . PROMETHAZINE HCL 12.5 MG PO TABS   Oral   Take 12.5 mg by mouth every 6 (six) hours as needed. nausea           BP 119/83  Pulse 96  Temp 99.4 F (37.4 C) (Oral)  Resp 18  Ht 5\' 9"  (1.753 m)  Wt 215 lb (97.523 kg)  BMI 31.75 kg/m2  SpO2 97%  Physical Exam  Nursing note and vitals reviewed. Constitutional: He is oriented to person, place, and time. He appears well-developed and well-nourished.  HENT:  Head: Normocephalic and atraumatic.  Right Ear: External ear normal.  Left Ear: External ear normal.  Eyes: Conjunctivae normal and EOM are normal. Pupils are equal, round, and reactive to light.  Neck: Normal range of motion and phonation normal. Neck supple.  Cardiovascular: Normal rate, regular rhythm, normal heart sounds and intact distal pulses.    Pulmonary/Chest: Effort normal and breath sounds normal. He exhibits no bony tenderness.  Abdominal: Soft. Normal appearance. There is no tenderness.  Musculoskeletal: Normal range of motion.  Neurological: He is alert and oriented to person, place, and time. He has normal strength. No cranial nerve deficit or sensory deficit. He exhibits normal muscle tone. Coordination normal.  Skin: Skin is warm, dry and intact.  Psychiatric: He has a normal mood and affect. His behavior is normal. Judgment and thought content normal.    ED Course  Procedures (including critical care time)  DIAGNOSTIC STUDIES: Oxygen Saturation is 96% on room air, adequate by my interpretation.    COORDINATION OF CARE:  6:24 PM Discussed treatment plan which includes CXR, CT scan of head and cervical spin, CBC panel, UA with pt at bedside and pt agreed to plan.    Reevaluation: 20:15- he is calm, and comfortable. Repeat vital signs are normal except mild diastolic hypertension. The facial wound is sutured.    LACERATION REPAIR Performed by: Flint Melter Consent: Verbal consent obtained. Risks and benefits: risks, benefits and alternatives were discussed Patient identity confirmed: provided demographic data Time out performed prior to procedure Prepped and Draped in normal sterile fashion Wound explored Laceration Location: left eyebrow Laceration Length: 3.5 cm No Foreign Bodies seen or palpated Anesthesia: local infiltration Local anesthetic: lidocaine 2% with epinephrine Anesthetic total: 4 ml Irrigation method: syringe Amount of cleaning: standard Skin closure: 4-0 Prolene Number of sutures or staples: 6 Technique: simple Patient tolerance: Patient tolerated the procedure well with no immediate complications.     Labs Reviewed  CBC WITH DIFFERENTIAL - Abnormal; Notable for the following:    Monocytes Absolute 1.1 (*)     All other components within normal  limits  BASIC METABOLIC PANEL -  Abnormal; Notable for the following:    Glucose, Bld 113 (*)     BUN 24 (*)     GFR calc non Af Amer 59 (*)     GFR calc Af Amer 69 (*)     All other components within normal limits  URINALYSIS, ROUTINE W REFLEX MICROSCOPIC - Abnormal; Notable for the following:    Specific Gravity, Urine >1.030 (*)     Bilirubin Urine SMALL (*)     Ketones, ur 15 (*)     Protein, ur 100 (*)     All other components within normal limits  URINE MICROSCOPIC-ADD ON - Abnormal; Notable for the following:    Bacteria, UA FEW (*)     All other components within normal limits  URINE CULTURE  URINE CULTURE   Dg Chest 1 View  08/11/2012  *RADIOLOGY REPORT*  Clinical Data: Weakness, altered level consciousness and fall.  CHEST - 1 VIEW  Comparison: 07/10/2012 and prior chest radiographs  Findings: The cardiomediastinal silhouette is unremarkable. There is no evidence of focal airspace disease, pulmonary edema, suspicious pulmonary nodule/mass, pleural effusion, or pneumothorax. No acute bony abnormalities are identified.  IMPRESSION: No evidence of active cardiopulmonary disease.   Original Report Authenticated By: Harmon Pier, M.D.    Ct Head Wo Contrast  08/11/2012  *RADIOLOGY REPORT*  Clinical Data:  62 year old male with headache and neck pain following fall.  CT HEAD WITHOUT CONTRAST CT CERVICAL SPINE WITHOUT CONTRAST  Technique:  Multidetector CT imaging of the head and cervical spine was performed following the standard protocol without intravenous contrast.  Multiplanar CT image reconstructions of the cervical spine were also generated.  Comparison:  07/21/2012 and prior head CTs.  05/04/2012 and prior cervical spine CTs.  CT HEAD  Findings: Left frontoparietal and temporal encephalomalacia is noted as well as remote infarcts in the right frontal lobe, left basal ganglia and right cerebellum. Atrophy and chronic small vessel white matter ischemic changes are again identified.  No acute intracranial abnormalities  are identified, including mass lesion or mass effect, hydrocephalus, extra-axial fluid collection, midline shift, hemorrhage, or acute infarction.  The visualized bony calvarium is unremarkable. Forehead soft tissue swelling is identified.  IMPRESSION: No evidence of acute intracranial abnormality.  Forehead soft tissue swelling without evidence of acute fracture.  Atrophy, chronic small vessel white matter ischemic changes and remote infarcts as described.  CT CERVICAL SPINE  Findings: Normal alignment is noted. There are no evidence of fracture, subluxation or prevertebral soft tissue swelling. Mild multilevel degenerative disc disease noted. There is soft tissue abnormalities are present.  IMPRESSION: No static evidence of acute injury to the cervical spine.   Original Report Authenticated By: Harmon Pier, M.D.    Ct Cervical Spine Wo Contrast  08/11/2012  *RADIOLOGY REPORT*  Clinical Data:  62 year old male with headache and neck pain following fall.  CT HEAD WITHOUT CONTRAST CT CERVICAL SPINE WITHOUT CONTRAST  Technique:  Multidetector CT imaging of the head and cervical spine was performed following the standard protocol without intravenous contrast.  Multiplanar CT image reconstructions of the cervical spine were also generated.  Comparison:  07/21/2012 and prior head CTs.  05/04/2012 and prior cervical spine CTs.  CT HEAD  Findings: Left frontoparietal and temporal encephalomalacia is noted as well as remote infarcts in the right frontal lobe, left basal ganglia and right cerebellum. Atrophy and chronic small vessel white matter ischemic changes are again identified.  No acute intracranial abnormalities  are identified, including mass lesion or mass effect, hydrocephalus, extra-axial fluid collection, midline shift, hemorrhage, or acute infarction.  The visualized bony calvarium is unremarkable. Forehead soft tissue swelling is identified.  IMPRESSION: No evidence of acute intracranial abnormality.   Forehead soft tissue swelling without evidence of acute fracture.  Atrophy, chronic small vessel white matter ischemic changes and remote infarcts as described.  CT CERVICAL SPINE  Findings: Normal alignment is noted. There are no evidence of fracture, subluxation or prevertebral soft tissue swelling. Mild multilevel degenerative disc disease noted. There is soft tissue abnormalities are present.  IMPRESSION: No static evidence of acute injury to the cervical spine.   Original Report Authenticated By: Harmon Pier, M.D.    Nursing notes, applicable records and vitals reviewed.  Radiologic Images/Reports reviewed.   1. Fall   2. Laceration of face   3. UTI (lower urinary tract infection)       MDM   apparent mechanical fall, with possible urinary tract infection. No serious injury. Stable for discharge to his current living setting    Plan: Home Medications- Keflex; Home Treatments- rest; Recommended follow up- sutures out 7 days. Return here when necessary      I personally performed the services described in this documentation, which was scribed in my presence. The recorded information has been reviewed and is accurate.     Flint Melter, MD 08/11/12 [phone]

## 2012-08-11 NOTE — ED Notes (Signed)
Pt removed from LSB by Dr. Effie Shy, c-spine held entire time, cms remains intact post removal of lsb, family at bedside,

## 2012-08-13 LAB — URINE CULTURE
Colony Count: NO GROWTH
Culture: NO GROWTH

## 2012-08-29 ENCOUNTER — Other Ambulatory Visit: Payer: Self-pay

## 2012-08-29 ENCOUNTER — Emergency Department (HOSPITAL_COMMUNITY): Payer: Medicaid Other

## 2012-08-29 ENCOUNTER — Emergency Department (HOSPITAL_COMMUNITY)
Admission: EM | Admit: 2012-08-29 | Discharge: 2012-08-29 | Disposition: A | Discharge status: Home / Self Care | Payer: Medicaid Other | Attending: Emergency Medicine | Admitting: Emergency Medicine

## 2012-08-29 ENCOUNTER — Encounter (HOSPITAL_COMMUNITY): Payer: Self-pay | Admitting: Emergency Medicine

## 2012-08-29 DIAGNOSIS — S0191XA Laceration without foreign body of unspecified part of head, initial encounter: Secondary | ICD-10-CM

## 2012-08-29 DIAGNOSIS — Z79899 Other long term (current) drug therapy: Secondary | ICD-10-CM | POA: Insufficient documentation

## 2012-08-29 DIAGNOSIS — I10 Essential (primary) hypertension: Secondary | ICD-10-CM | POA: Insufficient documentation

## 2012-08-29 DIAGNOSIS — S0180XA Unspecified open wound of other part of head, initial encounter: Secondary | ICD-10-CM | POA: Insufficient documentation

## 2012-08-29 DIAGNOSIS — G43909 Migraine, unspecified, not intractable, without status migrainosus: Secondary | ICD-10-CM | POA: Insufficient documentation

## 2012-08-29 DIAGNOSIS — E119 Type 2 diabetes mellitus without complications: Secondary | ICD-10-CM | POA: Insufficient documentation

## 2012-08-29 DIAGNOSIS — Z7982 Long term (current) use of aspirin: Secondary | ICD-10-CM | POA: Insufficient documentation

## 2012-08-29 DIAGNOSIS — W19XXXA Unspecified fall, initial encounter: Secondary | ICD-10-CM

## 2012-08-29 DIAGNOSIS — R569 Unspecified convulsions: Secondary | ICD-10-CM

## 2012-08-29 DIAGNOSIS — Z8739 Personal history of other diseases of the musculoskeletal system and connective tissue: Secondary | ICD-10-CM | POA: Insufficient documentation

## 2012-08-29 DIAGNOSIS — W050XXA Fall from non-moving wheelchair, initial encounter: Secondary | ICD-10-CM | POA: Insufficient documentation

## 2012-08-29 DIAGNOSIS — F0390 Unspecified dementia without behavioral disturbance: Secondary | ICD-10-CM | POA: Insufficient documentation

## 2012-08-29 DIAGNOSIS — Y939 Activity, unspecified: Secondary | ICD-10-CM | POA: Insufficient documentation

## 2012-08-29 DIAGNOSIS — Y9210 Unspecified residential institution as the place of occurrence of the external cause: Secondary | ICD-10-CM | POA: Insufficient documentation

## 2012-08-29 DIAGNOSIS — S0990XA Unspecified injury of head, initial encounter: Secondary | ICD-10-CM | POA: Insufficient documentation

## 2012-08-29 LAB — BASIC METABOLIC PANEL
CO2: 28 mEq/L (ref 19–32)
Calcium: 9.9 mg/dL (ref 8.4–10.5)
Chloride: 96 mEq/L (ref 96–112)
Creatinine, Ser: 1.17 mg/dL (ref 0.50–1.35)
Glucose, Bld: 130 mg/dL — ABNORMAL HIGH (ref 70–99)

## 2012-08-29 LAB — CBC WITH DIFFERENTIAL/PLATELET
Basophils Absolute: 0.1 10*3/uL (ref 0.0–0.1)
Eosinophils Relative: 2 % (ref 0–5)
HCT: 48 % (ref 39.0–52.0)
Hemoglobin: 17 g/dL (ref 13.0–17.0)
Lymphocytes Relative: 22 % (ref 12–46)
Lymphs Abs: 1.7 10*3/uL (ref 0.7–4.0)
MCV: 87.9 fL (ref 78.0–100.0)
Monocytes Absolute: 0.9 10*3/uL (ref 0.1–1.0)
Neutro Abs: 4.9 10*3/uL (ref 1.7–7.7)
RBC: 5.46 MIL/uL (ref 4.22–5.81)
RDW: 12.9 % (ref 11.5–15.5)
WBC: 7.7 10*3/uL (ref 4.0–10.5)

## 2012-08-29 LAB — POCT I-STAT, CHEM 8
BUN: 18 mg/dL (ref 6–23)
Creatinine, Ser: 1.4 mg/dL — ABNORMAL HIGH (ref 0.50–1.35)
Hemoglobin: 18.4 g/dL — ABNORMAL HIGH (ref 13.0–17.0)
Potassium: 4.2 mEq/L (ref 3.5–5.1)
Sodium: 138 mEq/L (ref 135–145)
TCO2: 30 mmol/L (ref 0–100)

## 2012-08-29 LAB — PROTIME-INR
INR: 0.96 (ref 0.00–1.49)
Prothrombin Time: 12.7 seconds (ref 11.6–15.2)

## 2012-08-29 LAB — VALPROIC ACID LEVEL: Valproic Acid Lvl: 68.1 ug/mL (ref 50.0–100.0)

## 2012-08-29 MED ORDER — LIDOCAINE-EPINEPHRINE (PF) 2 %-1:200000 IJ SOLN
INTRAMUSCULAR | Status: AC
  Filled 2012-08-29: qty 20

## 2012-08-29 MED ORDER — LIDOCAINE-EPINEPHRINE 2 %-1:100000 IJ SOLN: 30.0000 mL | Freq: Once | INTRAMUSCULAR | Status: DC

## 2012-08-29 NOTE — ED Provider Notes (Signed)
Medical screening examination/treatment/procedure(s) were conducted as a shared visit with non-physician practitioner(s) and myself.  I personally evaluated the patient during the encounter   Chris Octave, MD 08/29/12 1724

## 2012-08-29 NOTE — ED Notes (Signed)
Awaiting transport.

## 2012-08-29 NOTE — ED Provider Notes (Signed)
LACERATION REPAIR Performed by: Burgess Amor Authorized by: Burgess Amor Consent: Verbal consent obtained. Risks and benefits: risks, benefits and alternatives were discussed Consent given by: patient Patient identity confirmed: provided demographic data Prepped and Draped in normal sterile fashion Wound explored  Laceration Location: one laceration through right eyebrow,  Smaller 1 cm laceration,  Vertical along right lateral upper eyelid.  Laceration Length: 4 cm through brow,  1 cm per above  No Foreign Bodies seen or palpated  Anesthesia: local infiltration  Local anesthetic: lidocaine 2% with epinephrine  Anesthetic total: 2 ml  Irrigation method: syringe Amount of cleaning: standard, using wound cleanser and peroxide to surrounding skin to loosen dried blood  Skin closure: ethilon 5-0 and vicryl 4-0 rapide   Number of sutures: #2 subcutaneous.  #6 superficial through brow,  #2 lateral right eyelid  Technique: simple interrupted  Patient tolerance: Patient tolerated the procedure well with no immediate complications.      Burgess Amor, [place] 08/29/12 1627

## 2012-08-29 NOTE — ED Notes (Signed)
Report given to [facility] nurse, Dennie Bible.

## 2012-08-29 NOTE — ED Notes (Signed)
Patient arrives via EMS from [facility] with c/o seizure like activity and fall. Staff reports that the patient's roommate witnessed patient start "shaking and fall from wheelchair". Laceration to right eye brow. H/o dementia, epilepsy

## 2012-08-29 NOTE — ED Notes (Signed)
[facility] here to transport back  to [facility].

## 2012-08-29 NOTE — ED Notes (Signed)
Patient left ED at this time with [facility] for transport back to [facility].

## 2012-08-29 NOTE — ED Provider Notes (Signed)
History     CSN: [id number]  Arrival date & time 08/29/12  1428   First MD Initiated Contact with Patient 08/29/12 1432      Chief Complaint  Patient presents with  . Seizures  . Fall    (Consider location/radiation/quality/duration/timing/severity/associated sxs/prior treatment) HPI Comments: Patient from nursing home where his roommate reported patient had seizure activity in a wheelchair and fell and hit his head on a nightstand. No loss of consciousness. Reportedly postictal on arrival. History of seizure disorder on Trileptal, Dilantin and Depakote. No recent illnesses, no fever, cough congestion, nausea or vomiting. Patient alert and oriented x3.  The history is provided by the patient and a caregiver. The history is limited by a developmental delay.    Past Medical History  Diagnosis Date  . Seizures   . Hypertension   . Diabetes mellitus   . Muscle weakness   . Dementia     History reviewed. No pertinent past surgical history.  No family history on file.  History  Substance Use Topics  . Smoking status: Not on file  . Smokeless tobacco: Not on file  . Alcohol Use: No      Review of Systems  Unable to perform ROS: Dementia  Neurological: Positive for seizures.    Allergies  Review of patient's allergies indicates no known allergies.  Home Medications   Current Outpatient Rx  Name  Route  Sig  Dispense  Refill  . aspirin 81 MG tablet   Oral   Take 81 mg by mouth daily.         . divalproex (DEPAKOTE ER) 500 MG 24 hr tablet   Oral   Take 500 mg by mouth every 8 (eight) hours. Given at 600,1400,2200         . famotidine (PEPCID) 20 MG tablet   Oral   Take 10 mg by mouth daily at 6 (six) AM.          . glipiZIDE (GLUCOTROL XL) 5 MG 24 hr tablet   Oral   Take 5 mg by mouth daily.         Marland Kitchen LORazepam (ATIVAN) 0.5 MG tablet   Oral   Take 0.5 mg by mouth every 8 (eight) hours. For epilepsy given at 0600,1400,2200         . magnesium  hydroxide (MILK OF MAGNESIA) 400 MG/5ML suspension   Oral   Take 30 mLs by mouth daily as needed. constipation         . metFORMIN (GLUCOPHAGE) 500 MG tablet   Oral   Take 500 mg by mouth 2 (two) times daily with a meal.         . mirtazapine (REMERON) 30 MG tablet   Oral   Take 30 mg by mouth at bedtime.         . Oxcarbazepine (TRILEPTAL) 300 MG tablet   Oral   Take 300 mg by mouth 2 (two) times daily.         . phenytoin (DILANTIN) 100 MG ER capsule   Oral   Take 200 mg by mouth 2 (two) times daily.          . promethazine (PHENERGAN) 12.5 MG suppository   Rectal   Place 12.5 mg rectally every 6 (six) hours as needed. nausea         . promethazine (PHENERGAN) 12.5 MG tablet   Oral   Take 12.5 mg by mouth every 6 (six) hours as needed. nausea         .  senna-docusate (SENOKOT-S) 8.6-50 MG per tablet   Oral   Take 1 tablet by mouth daily.         . sertraline (ZOLOFT) 50 MG tablet   Oral   Take 50 mg by mouth 2 (two) times daily.          Marland Kitchen acetaminophen (TYLENOL) 650 MG CR tablet   Oral   Take 650 mg by mouth every 4 (four) hours as needed. pain           BP 135/93  Pulse 99  Temp(Src) 99.2 F (37.3 C) (Oral)  SpO2 96%  Physical Exam  Constitutional: He is oriented to person, place, and time. He appears well-developed and well-nourished. No distress.  HENT:  Head: Normocephalic and atraumatic.  Mouth/Throat: No oropharyngeal exudate.  Irregular 2 cm avulsion/laceration to right medial eyebrow L forehead avulsion without active bleeding.  Eyes: Conjunctivae and EOM are normal. Pupils are equal, round, and reactive to light.  Neck: Normal range of motion. Neck supple.  No midline C-spine tenderness  Cardiovascular: Normal rate, regular rhythm and normal heart sounds.   No murmur heard. Pulmonary/Chest: Effort normal. No respiratory distress.  Abdominal: Soft. There is no tenderness. There is no rebound and no guarding.   Musculoskeletal: Normal range of motion. He exhibits no edema and no tenderness.  Neurological: He is alert and oriented to person, place, and time. No cranial nerve deficit. He exhibits normal muscle tone. Coordination normal.  Follows commands, moving all extremities  Skin: Skin is warm.    ED Course  Procedures (including critical care time)  Labs Reviewed  BASIC METABOLIC PANEL - Abnormal; Notable for the following:    Glucose, Bld 130 (*)    GFR calc non Af Amer 67 (*)    GFR calc Af Amer 77 (*)    All other components within normal limits  POCT I-STAT, CHEM 8 - Abnormal; Notable for the following:    Creatinine, Ser 1.40 (*)    Glucose, Bld 131 (*)    Hemoglobin 18.4 (*)    HCT 54.0 (*)    All other components within normal limits  CBC WITH DIFFERENTIAL  PROTIME-INR  PHENYTOIN LEVEL, TOTAL  VALPROIC ACID LEVEL  URINALYSIS, ROUTINE W REFLEX MICROSCOPIC   Ct Head Wo Contrast  08/29/2012  *RADIOLOGY REPORT*  Clinical Data:  Seizure, fall fall from wheelchair, history hypertension, diabetes, dementia, right supraorbital laceration  CT HEAD WITHOUT CONTRAST CT CERVICAL SPINE WITHOUT CONTRAST  Technique:  Multidetector CT imaging of the head and cervical spine was performed following the standard protocol without intravenous contrast.  Multiplanar CT image reconstructions of the cervical spine were also generated.  Comparison:  08/11/2012  CT HEAD  Findings: Generalized atrophy. Prominent ventricular system with ex vacuo dilatation of left lateral ventricle. No midline shift or mass effect. Large areas of encephalomalacia in left frontal, left parietal and anterior left temporal lobes compatible with old MCA territory infarct. Scattered small vessel chronic ischemic changes of deep cerebral white matter. Old left basal ganglion lacunar infarct. No intracranial hemorrhage, mass lesion or evidence of acute infarction. No extra-axial fluid collections. Material within left external  auditory canal question cerumen. No focal bony or sinus abnormality identified. Small right frontal scalp hematoma.  IMPRESSION: Atrophy with minimal small vessel chronic ischemic changes of deep cerebral white matter. 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 No acute intracranial abnormalities.  CT CERVICAL SPINE  Findings: Visualized skull base intact. Bones demineralized. Anterior spur formation throughout cervical spine greatest  at C4- C5. Prevertebral soft tissues normal thickness. Vertebral body heights maintained without fracture, subluxation, or bone destruction. Visualized lung apices clear.  IMPRESSION: Scattered degenerative disc disease changes.  No acute abnormalities.   Original Report Authenticated By: Ulyses Southward, M.D.    Ct Cervical Spine Wo Contrast  08/29/2012  *RADIOLOGY REPORT*  Clinical Data:  Seizure, fall fall from wheelchair, history hypertension, diabetes, dementia, right supraorbital laceration  CT HEAD WITHOUT CONTRAST CT CERVICAL SPINE WITHOUT CONTRAST  Technique:  Multidetector CT imaging of the head and cervical spine was performed following the standard protocol without intravenous contrast.  Multiplanar CT image reconstructions of the cervical spine were also generated.  Comparison:  08/11/2012  CT HEAD  Findings: Generalized atrophy. Prominent ventricular system with ex vacuo dilatation of left lateral ventricle. No midline shift or mass effect. Large areas of encephalomalacia in left frontal, left parietal and anterior left temporal lobes compatible with old MCA territory infarct. Scattered small vessel chronic ischemic changes of deep cerebral white matter. Old left basal ganglion lacunar infarct. No intracranial hemorrhage, mass lesion or evidence of acute infarction. No extra-axial fluid collections. Material within left external auditory canal question cerumen. No focal bony or sinus abnormality identified. Small right frontal scalp hematoma.  IMPRESSION: Atrophy with minimal  small vessel chronic ischemic changes of deep cerebral white matter. 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 No acute intracranial abnormalities.  CT CERVICAL SPINE  Findings: Visualized skull base intact. Bones demineralized. Anterior spur formation throughout cervical spine greatest at C4- C5. Prevertebral soft tissues normal thickness. Vertebral body heights maintained without fracture, subluxation, or bone destruction. Visualized lung apices clear.  IMPRESSION: Scattered degenerative disc disease changes.  No acute abnormalities.   Original Report Authenticated By: Ulyses Southward, M.D.      No diagnosis found.    MDM  Seizure with seizure disorder and subsequent fall with head injury. No loss of consciousness. Not on anticoagulants. CT head, labs, antiepileptic levels, repair laceration  Dilantin and depakote therapeutic. Patient awake and alert and following commands. CT head negative for acute pathology. Old MCA infarct. Patient at baseline mentation per nursing home staff. No seizure activity in ED. Laceration repaired by [facility]. Stable for return to facility.   Date: 08/29/2012  Rate: 102 Rhythm: sinus tachycardia  QRS Axis: left  Intervals: normal  ST/T Wave abnormalities: normal  Conduction Disutrbances:none  Narrative Interpretation:   Old EKG Reviewed: unchanged      Glynn Octave, MD 08/29/12 1739

## 2012-09-09 ENCOUNTER — Inpatient Hospital Stay (HOSPITAL_COMMUNITY)
Admission: EM | Admit: 2012-09-09 | Discharge: 2012-09-14 | DRG: 871 | Disposition: A | Discharge status: Skilled Nursing Facility | Payer: Medicaid Other | Attending: Family Medicine | Admitting: Family Medicine

## 2012-09-09 ENCOUNTER — Inpatient Hospital Stay (HOSPITAL_COMMUNITY): Payer: Medicaid Other

## 2012-09-09 ENCOUNTER — Encounter (HOSPITAL_COMMUNITY): Payer: Self-pay | Admitting: Emergency Medicine

## 2012-09-09 DIAGNOSIS — T50995A Adverse effect of other drugs, medicaments and biological substances, initial encounter: Secondary | ICD-10-CM | POA: Diagnosis present

## 2012-09-09 DIAGNOSIS — N390 Urinary tract infection, site not specified: Secondary | ICD-10-CM | POA: Diagnosis present

## 2012-09-09 DIAGNOSIS — F0390 Unspecified dementia without behavioral disturbance: Secondary | ICD-10-CM | POA: Diagnosis present

## 2012-09-09 DIAGNOSIS — T420X5A Adverse effect of hydantoin derivatives, initial encounter: Secondary | ICD-10-CM | POA: Diagnosis present

## 2012-09-09 DIAGNOSIS — E860 Dehydration: Secondary | ICD-10-CM

## 2012-09-09 DIAGNOSIS — I10 Essential (primary) hypertension: Secondary | ICD-10-CM | POA: Diagnosis present

## 2012-09-09 DIAGNOSIS — E876 Hypokalemia: Secondary | ICD-10-CM | POA: Diagnosis present

## 2012-09-09 DIAGNOSIS — G9340 Encephalopathy, unspecified: Secondary | ICD-10-CM

## 2012-09-09 DIAGNOSIS — Z7982 Long term (current) use of aspirin: Secondary | ICD-10-CM

## 2012-09-09 DIAGNOSIS — A419 Sepsis, unspecified organism: Principal | ICD-10-CM

## 2012-09-09 DIAGNOSIS — G40909 Epilepsy, unspecified, not intractable, without status epilepticus: Secondary | ICD-10-CM | POA: Diagnosis present

## 2012-09-09 DIAGNOSIS — Z79899 Other long term (current) drug therapy: Secondary | ICD-10-CM

## 2012-09-09 DIAGNOSIS — E119 Type 2 diabetes mellitus without complications: Secondary | ICD-10-CM | POA: Diagnosis present

## 2012-09-09 DIAGNOSIS — T420X1A Poisoning by hydantoin derivatives, accidental (unintentional), initial encounter: Secondary | ICD-10-CM

## 2012-09-09 DIAGNOSIS — G9341 Metabolic encephalopathy: Secondary | ICD-10-CM

## 2012-09-09 DIAGNOSIS — E870 Hyperosmolality and hypernatremia: Secondary | ICD-10-CM

## 2012-09-09 LAB — URINALYSIS, ROUTINE W REFLEX MICROSCOPIC
Leukocytes, UA: NEGATIVE
Nitrite: NEGATIVE
Protein, ur: 100 mg/dL — AB
Specific Gravity, Urine: >1.03 — ABNORMAL HIGH (ref 1.005–1.030)
Urobilinogen, UA: 1 mg/dL (ref 0.0–1.0)

## 2012-09-09 LAB — BASIC METABOLIC PANEL
Calcium: 8.5 mg/dL (ref 8.4–10.5)
GFR calc Af Amer: 81 mL/min — ABNORMAL LOW (ref >90)
GFR calc non Af Amer: 70 mL/min — ABNORMAL LOW (ref >90)
Glucose, Bld: 161 mg/dL — ABNORMAL HIGH (ref 70–99)
Sodium: 151 mEq/L — ABNORMAL HIGH (ref 135–145)

## 2012-09-09 LAB — GLUCOSE, CAPILLARY
Glucose-Capillary: 162 mg/dL — ABNORMAL HIGH (ref 70–99)
Glucose-Capillary: 174 mg/dL — ABNORMAL HIGH (ref 70–99)

## 2012-09-09 LAB — CBC
HCT: 45.1 % (ref 39.0–52.0)
Hemoglobin: 14.9 g/dL (ref 13.0–17.0)
MCH: 30.3 pg (ref 26.0–34.0)
MCHC: 33 g/dL (ref 30.0–36.0)
RDW: 13.8 % (ref 11.5–15.5)

## 2012-09-09 LAB — COMPREHENSIVE METABOLIC PANEL
Albumin: 3.1 g/dL — ABNORMAL LOW (ref 3.5–5.2)
BUN: 35 mg/dL — ABNORMAL HIGH (ref 6–23)
Calcium: 8.4 mg/dL (ref 8.4–10.5)
Creatinine, Ser: 1.48 mg/dL — ABNORMAL HIGH (ref 0.50–1.35)
GFR calc Af Amer: 58 mL/min — ABNORMAL LOW (ref >90)
Glucose, Bld: 166 mg/dL — ABNORMAL HIGH (ref 70–99)
Total Protein: 6.8 g/dL (ref 6.0–8.3)

## 2012-09-09 LAB — VALPROIC ACID LEVEL: Valproic Acid Lvl: 18.1 ug/mL — ABNORMAL LOW (ref 50.0–100.0)

## 2012-09-09 LAB — URINE MICROSCOPIC-ADD ON

## 2012-09-09 LAB — PROTIME-INR
INR: 1.35 (ref 0.00–1.49)
Prothrombin Time: 16.4 seconds — ABNORMAL HIGH (ref 11.6–15.2)

## 2012-09-09 LAB — MRSA PCR SCREENING: MRSA by PCR: POSITIVE — AB

## 2012-09-09 LAB — APTT: aPTT: 30 seconds (ref 24–37)

## 2012-09-09 LAB — PHOSPHORUS: Phosphorus: 4.6 mg/dL (ref 2.3–4.6)

## 2012-09-09 LAB — LACTIC ACID, PLASMA: Lactic Acid, Venous: 1.6 mmol/L (ref 0.5–2.2)

## 2012-09-09 LAB — MAGNESIUM: Magnesium: 2.2 mg/dL (ref 1.5–2.5)

## 2012-09-09 MED ORDER — POLYETHYLENE GLYCOL 3350 17 G PO PACK: 17.0000 g | PACK | Freq: Every day | ORAL | Status: DC | PRN

## 2012-09-09 MED ORDER — SODIUM CHLORIDE 0.9 % IV SOLN
500.0000 mg | Freq: Two times a day (BID) | INTRAVENOUS | Status: DC
  Administered 2012-09-09 – 2012-09-11 (×5): 500 mg via INTRAVENOUS
  Filled 2012-09-09 (×7): qty 5

## 2012-09-09 MED ORDER — VANCOMYCIN HCL IN DEXTROSE 1-5 GM/200ML-% IV SOLN
1000.0000 mg | Freq: Two times a day (BID) | INTRAVENOUS | Status: DC
  Administered 2012-09-09 – 2012-09-11 (×4): 1000 mg via INTRAVENOUS
  Filled 2012-09-09 (×7): qty 200

## 2012-09-09 MED ORDER — SODIUM CHLORIDE 0.9 % IV SOLN
1000.0000 mL | Freq: Once | INTRAVENOUS | Status: AC
  Administered 2012-09-09: 1000 mL via INTRAVENOUS

## 2012-09-09 MED ORDER — INSULIN ASPART 100 UNIT/ML ~~LOC~~ SOLN
0.0000 [IU] | SUBCUTANEOUS | Status: DC
  Administered 2012-09-09: 2 [IU] via SUBCUTANEOUS
  Administered 2012-09-09 – 2012-09-10 (×6): 3 [IU] via SUBCUTANEOUS

## 2012-09-09 MED ORDER — HEPARIN SODIUM (PORCINE) 5000 UNIT/ML IJ SOLN
5000.0000 [IU] | Freq: Three times a day (TID) | INTRAMUSCULAR | Status: DC
  Administered 2012-09-09 – 2012-09-14 (×16): 5000 [IU] via SUBCUTANEOUS
  Filled 2012-09-09 (×16): qty 1

## 2012-09-09 MED ORDER — DEXTROSE 5 % IV SOLN
INTRAVENOUS | Status: DC
  Administered 2012-09-09 – 2012-09-10 (×3): via INTRAVENOUS

## 2012-09-09 MED ORDER — SODIUM CHLORIDE 0.9 % IJ SOLN
3.0000 mL | Freq: Two times a day (BID) | INTRAMUSCULAR | Status: DC
  Administered 2012-09-09: 22:00:00 via INTRAVENOUS
  Administered 2012-09-09 – 2012-09-12 (×4): 3 mL via INTRAVENOUS

## 2012-09-09 MED ORDER — ONDANSETRON HCL 4 MG/2ML IJ SOLN: 4.0000 mg | INTRAMUSCULAR | Status: DC | PRN

## 2012-09-09 MED ORDER — PANTOPRAZOLE SODIUM 40 MG IV SOLR
40.0000 mg | INTRAVENOUS | Status: DC
  Administered 2012-09-09 – 2012-09-11 (×3): 40 mg via INTRAVENOUS
  Filled 2012-09-09 (×3): qty 40

## 2012-09-09 MED ORDER — SORBITOL 70 % SOLN: 30.0000 mL | Freq: Every day | Status: DC | PRN

## 2012-09-09 MED ORDER — PANTOPRAZOLE SODIUM 40 MG PO TBEC: 40.0000 mg | DELAYED_RELEASE_TABLET | Freq: Every day | ORAL | Status: DC

## 2012-09-09 MED ORDER — LEVOFLOXACIN IN D5W 750 MG/150ML IV SOLN
INTRAVENOUS | Status: AC
  Filled 2012-09-09: qty 150

## 2012-09-09 MED ORDER — DEXTROSE 5 % IV SOLN
2.0000 g | Freq: Two times a day (BID) | INTRAVENOUS | Status: DC
  Administered 2012-09-09: 2 g via INTRAVENOUS
  Filled 2012-09-09: qty 2

## 2012-09-09 MED ORDER — VANCOMYCIN HCL IN DEXTROSE 1-5 GM/200ML-% IV SOLN
1000.0000 mg | Freq: Once | INTRAVENOUS | Status: AC
  Administered 2012-09-09: 1000 mg via INTRAVENOUS
  Filled 2012-09-09: qty 200

## 2012-09-09 MED ORDER — DEXTROSE 5 % IV SOLN
1.0000 g | Freq: Three times a day (TID) | INTRAVENOUS | Status: DC
  Administered 2012-09-09 – 2012-09-13 (×13): 1 g via INTRAVENOUS
  Filled 2012-09-09 (×22): qty 1

## 2012-09-09 MED ORDER — ASPIRIN 300 MG RE SUPP
300.0000 mg | Freq: Every day | RECTAL | Status: DC
  Administered 2012-09-09 – 2012-09-11 (×3): 300 mg via RECTAL
  Filled 2012-09-09 (×5): qty 1

## 2012-09-09 MED ORDER — ACETAMINOPHEN 650 MG RE SUPP
650.0000 mg | Freq: Once | RECTAL | Status: AC
  Administered 2012-09-09: 650 mg via RECTAL
  Filled 2012-09-09: qty 1

## 2012-09-09 MED ORDER — LEVETIRACETAM 500 MG PO TABS: 500.0000 mg | ORAL_TABLET | Freq: Two times a day (BID) | ORAL | Status: DC

## 2012-09-09 MED ORDER — DEXTROSE 5 % IV SOLN
INTRAVENOUS | Status: AC
  Filled 2012-09-09: qty 2

## 2012-09-09 MED ORDER — SODIUM CHLORIDE 0.9 % IV SOLN
1000.0000 mL | INTRAVENOUS | Status: DC
  Administered 2012-09-09: 1000 mL via INTRAVENOUS

## 2012-09-09 MED ORDER — LEVOFLOXACIN IN D5W 750 MG/150ML IV SOLN
750.0000 mg | INTRAVENOUS | Status: DC
  Administered 2012-09-09 – 2012-09-11 (×3): 750 mg via INTRAVENOUS
  Filled 2012-09-09 (×4): qty 150

## 2012-09-09 NOTE — Progress Notes (Signed)
ANTIBIOTIC CONSULT NOTE - INITIAL  Pharmacy Consult for  Vancomycin, levofloxacin, cefepime Indication: sepsis  No Known Allergies  Patient Measurements: Height: 6' (182.9 cm) Weight: 167 lb 15.9 oz (76.2 kg) IBW/kg (Calculated) : 77.6   Vital Signs: Temp: 101 F (38.3 C) (02/24 0234) Temp src: Rectal (02/24 0234) BP: 121/82 mmHg (02/24 0400) Pulse Rate: 106 (02/24 0400) Intake/Output from previous day: 02/23 0701 - 02/24 0700 In: 381.3 [I.V.:381.3] Out: 150 [Urine:150] Intake/Output from this shift: Total I/O In: 381.3 [I.V.:381.3] Out: 150 [Urine:150]  Labs: No results found for this basename: WBC, HGB, PLT, LABCREA, CREATININE,  in the last 72 hours Estimated Creatinine Clearance: 61.2 ml/min (by C-G formula based on Cr of 1.4). No results found for this basename: VANCOTROUGH, Leodis Binet, VANCORANDOM, GENTTROUGH, GENTPEAK, GENTRANDOM, TOBRATROUGH, TOBRAPEAK, TOBRARND, AMIKACINPEAK, AMIKACINTROU, AMIKACIN,  in the last 72 hours   Microbiology: Recent Results (from the past 720 hour(s))  CULTURE, BLOOD (ROUTINE X 2)     Status: None   Collection Time    09/09/12 12:45 AM      Result Value Range Status   Specimen Description Blood RIGHT ANTECUBITAL   Final   Special Requests BOTTLES DRAWN AEROBIC AND ANAEROBIC 5CC   Final   Culture PENDING   Incomplete   Report Status PENDING   Incomplete  CULTURE, BLOOD (ROUTINE X 2)     Status: None   Collection Time    09/09/12 12:50 AM      Result Value Range Status   Specimen Description Blood BLOOD LEFT HAND DRAWN BY RN [facility]   Final   Special Requests BOTTLES DRAWN AEROBIC AND ANAEROBIC 6CC   Final   Culture PENDING   Incomplete   Report Status PENDING   Incomplete    Medical History: Past Medical History  Diagnosis Date  . Seizures   . Hypertension   . Diabetes mellitus   . Muscle weakness   . Dementia     Medications:  Scheduled:  . [COMPLETED] sodium chloride  1,000 mL Intravenous Once   Followed by  .  [COMPLETED] sodium chloride  1,000 mL Intravenous Once  . [COMPLETED] acetaminophen  650 mg Rectal Once  . aspirin  300 mg Rectal Daily  . ceFEPime (MAXIPIME) IV  2 g Intravenous Q12H  . heparin  5,000 Units Subcutaneous Q8H  . insulin aspart  0-15 Units Subcutaneous Q4H  . levofloxacin (LEVAQUIN) IV  750 mg Intravenous Q24H  . pantoprazole  40 mg Oral Daily  . sodium chloride  3 mL Intravenous Q12H  . [COMPLETED] vancomycin  1,000 mg Intravenous Once   Infusions:  . dextrose 125 mL/hr at 09/09/12 0453  . [DISCONTINUED] sodium chloride 1,000 mL (09/09/12 0400)   PRN: ondansetron (ZOFRAN) IV, polyethylene glycol, sorbitol  Assessment: 17yr male underweight with probable urosepsis.  SCr may not reflect acurate CrCl estimation  Goal of Therapy:  Desire vancomycin troughs 15-68mcg/ml.  Will dose Levaquin and Maxipime aggressively for sepsis, although may need to re-adjust regimen dosage downward  Plan:  1.  Vancomycin 1gm x 1 dose already given @ 0130.  Clinical pharmacist will order maintenance regimen in am. 2.  Start Cefepime 2gm IV q12h 3.  Start Levaquin 750mg  IV q24hr  Scarlett Presto 09/09/2012,5:23 AM

## 2012-09-09 NOTE — Progress Notes (Signed)
Patients sister told RN that patient has had a very poor appetite/not eating for approx. 3 weeks and has lost about 20 pounds from this.  She also mentioned that he has gotten extremely weak from not eating (falling out of wheelchair and unable to sit up in bed unassisted)

## 2012-09-09 NOTE — Care Management Note (Unsigned)
    Page 1 of 1   09/09/2012     10:21:27 AM   CARE MANAGEMENT NOTE 09/09/2012  Patient:  [name], [name]   Account Number:  [id number]  Date Initiated:  09/09/2012  Documentation initiated by:  Sharrie Rothman  Subjective/Objective Assessment:   Pt admitted from [facility] with urosepsis. Pt will return to [facility] at discharge. Pts sister, [name], stated that pt requires extensive assistance with ADL's at the facility.     Action/Plan:   CSW is aware of pts admission and will arrange discharge to facility when medically stable.   Anticipated DC Date:  09/13/2012   Anticipated DC Plan:  SKILLED NURSING FACILITY  In-house referral  Clinical Social Worker      DC Planning Services  CM consult      Choice offered to / List presented to:             Status of service:  Completed, signed off Medicare Important Message given?   (If response is "NO", the following Medicare IM given date fields will be blank) Date Medicare IM given:   Date Additional Medicare IM given:    Discharge Disposition:    Per UR Regulation:    If discussed at Long Length of Stay Meetings, dates discussed:    Comments:  09/09/12 1020 Arlyss Queen, RN BSN CM

## 2012-09-09 NOTE — H&P (Signed)
[facility] History and Physical  [name]  FAO:[id number]  DOB: [birth year]   DOA: 09/09/2012  History was taken by reviewing the chart and discussion with patient's sister/healthcare power of attorney [name]  PCP:   Pearson Grippe M.D. Neurologist: Dr. Beryle Beams, M.D.  Chief Complaint:  Fever tachycardia and decreased urine output  HPI: [name] is an 62 y.o. male.   Caucasian gentleman, nonverbal and mobile only by wheelchair,  nursing home confined for the past 8 years, status post right brain injury in 1996, fell out of his wheelchair 10 days ago had laceration over the left eye it was sutured, x-rays negative for acute injury.  For the past few days patient has been more lethargic, not eating, developed fever tachycardia today and lab work was ordered showed: WBC 15.3,  hemoglobin 18.1, sodium 150,  CO2 26,  BUN 38,  creatinine 1.47, Dilantin level 22.2, albumin 3.7  Because of fever tachycardia and decreased urine output patient was sent to the emergency room for evaluation Heart rate was noted to be in the 130s, respiration 28  Patient did visit his neurologist earlier this week  Sister gives his CODE STATUS as DO NOT INTUBATE, although nursing home records indicate that he's a full code.  Rewiew of Systems:  Unable to obtain further because of patient's mental status   Past Medical History  Diagnosis Date  . Seizures   . Hypertension   . Diabetes mellitus   . Muscle weakness   . Dementia     History reviewed. No pertinent past surgical history.  Medications:  HOME MEDS: Prior to Admission medications   Medication Sig Start Date End Date Taking? Authorizing Provider  acetaminophen (TYLENOL) 650 MG CR tablet Take 650 mg by mouth every 4 (four) hours as needed. pain    Historical Provider, MD  aspirin 81 MG tablet Take 81 mg by mouth daily.    Historical Provider, MD  divalproex (DEPAKOTE ER) 500 MG 24 hr tablet Take 500 mg by mouth every 8  (eight) hours. Given at 600,1400,2200    Historical Provider, MD  famotidine (PEPCID) 20 MG tablet Take 10 mg by mouth daily at 6 (six) AM.     Historical Provider, MD  glipiZIDE (GLUCOTROL XL) 5 MG 24 hr tablet Take 5 mg by mouth daily.    Historical Provider, MD  LORazepam (ATIVAN) 0.5 MG tablet Take 0.5 mg by mouth every 8 (eight) hours. For epilepsy given at 0600,1400,2200    Historical Provider, MD  magnesium hydroxide (MILK OF MAGNESIA) 400 MG/5ML suspension Take 30 mLs by mouth daily as needed. constipation    Historical Provider, MD  metFORMIN (GLUCOPHAGE) 500 MG tablet Take 500 mg by mouth 2 (two) times daily with a meal.    Historical Provider, MD  mirtazapine (REMERON) 30 MG tablet Take 30 mg by mouth at bedtime.    Historical Provider, MD  Oxcarbazepine (TRILEPTAL) 300 MG tablet Take 300 mg by mouth 2 (two) times daily.    Historical Provider, MD  phenytoin (DILANTIN) 100 MG ER capsule Take 200 mg by mouth 2 (two) times daily.     Historical Provider, MD  promethazine (PHENERGAN) 12.5 MG suppository Place 12.5 mg rectally every 6 (six) hours as needed. nausea    Historical Provider, MD  promethazine (PHENERGAN) 12.5 MG tablet Take 12.5 mg by mouth every 6 (six) hours as needed. nausea    Historical Provider, MD  senna-docusate (SENOKOT-S) 8.6-50 MG per tablet Take 1 tablet  by mouth daily.    Historical Provider, MD  sertraline (ZOLOFT) 50 MG tablet Take 50 mg by mouth 2 (two) times daily.     Historical Provider, MD     Allergies:  No Known Allergies  Social History:   reports that he does not drink alcohol. His tobacco and drug histories are not on file.  Family History: History reviewed. No pertinent family history.   Physical Exam: Filed Vitals:   09/09/12 0230 09/09/12 0234 09/09/12 0300 09/09/12 0400  BP: 127/88  113/82 121/82  Pulse: 109  108 106  Temp:  101 F (38.3 C)    TempSrc:  Rectal    Resp: 20  22 20   Height:    6' (1.829 m)  Weight:    76.2 kg (167 lb  15.9 oz)  SpO2: 100%  100% 99%   Blood pressure 121/82, pulse 106, temperature 101 F (38.3 C), temperature source Rectal, resp. rate 20, height 6' (1.829 m), weight 76.2 kg (167 lb 15.9 oz), SpO2 99.00%.  GEN: Comatosed middle-aged Caucasian gentleman lying in the stretcher, keeps eyes closed; sutures notice throughd left eyebrow; nonspecific response to pain PSYCH: Unable to assess because of comatose state HEENT: Mucous membranes pink, dry, and anicteric; no cervical lymphadenopathy nor thyromegaly or carotid bruit; no JVD; Breasts:: Not examined CHEST WALL: No tenderness CHEST: Normal respiration, clear to auscultation bilaterally HEART: Tachycardic regular rhythm; no murmurs rubs or gallops BACK: No kyphosis or scoliosis; no CVA tenderness ABDOMEN:  soft non-tender; no masses, no organomegaly, normal abdominal bowel sounds; no pannus; no intertriginous candida. Rectal Exam: Not done EXTREMITIES:  a arthropathy of the hands and knees; bruises of the shins; no edema; no ulcerations. Genitalia: not examined PULSES: 2+ and symmetric SKIN: Normal hydration no rash or ulceration CNS: No lateralizing deficit noted   Labs on Admission:  Basic Metabolic Panel: No results found for this basename: NA, K, CL, CO2, GLUCOSE, BUN, CREATININE, CALCIUM, MG, PHOS,  in the last 168 hours Liver Function Tests: No results found for this basename: AST, ALT, ALKPHOS, BILITOT, PROT, ALBUMIN,  in the last 168 hours No results found for this basename: LIPASE, AMYLASE,  in the last 168 hours No results found for this basename: AMMONIA,  in the last 168 hours CBC: No results found for this basename: WBC, NEUTROABS, HGB, HCT, MCV, PLT,  in the last 168 hours Cardiac Enzymes: No results found for this basename: CKTOTAL, CKMB, CKMBINDEX, TROPONINI,  in the last 168 hours BNP: No components found with this basename: POCBNP,  D-dimer: No components found with this basename: D-DIMER,  CBG: No results found  for this basename: GLUCAP,  in the last 168 hours  Radiological Exams on Admission: No results found.  EKG: Independently reviewed. Sinus tachycardia; left anterior fascicular block   Assessment/Plan Present on Admission:  . Encephalopathy, metabolic, probably due to sepsis possibly due to further brain injury . Sepsis, likely the cause of #1  . Hypernatremia . Dehydration Elevated Dilantin levels Diabetes type 2  PLAN: Admit this gentleman to the intensive care unit for hydration with D5W for his hyponatremia; broad-spectrum antibiotic coverage after cultures of blood and urine;  Repeat serum sodium in a few hours  Will hold sedating medications including his antiepileptic medications, and repeat a blood levels tomorrow morning  Antidiabetic medications while he is n.p.o. but give a sliding scale insulin coverage    Other plans as per orders.  DNI Family Communication: hcpoa present for examination and discussion of plan  Disposition Plan:   Critical care time: 60 minutes.  Bosten Newstrom Nocturnist [facility] Pager [phone]   09/09/2012, 4:31 AM

## 2012-09-09 NOTE — ED Provider Notes (Signed)
History     CSN: [id number]  Arrival date & time 09/09/12  0021   First MD Initiated Contact with Patient 09/09/12 [phone]      Chief Complaint  Patient presents with  . Fever  . Urinary Retention    Patient is a 62 y.o. male presenting with fever. The history is provided by a caregiver and a relative. The history is limited by the condition of the patient.  Fever Severity:  Severe Onset quality:  Sudden Timing:  Constant Progression:  Worsening Chronicity:  New Relieved by:  Acetaminophen Worsened by:  Nothing tried Ineffective treatments:  Acetaminophen Associated symptoms: confusion   pt presents from nursing facility This patient has h/o seizures and developmental delay It was noted that he had decreased urine output and spiked a fever, and he also appeared more confused and less interactive While at nursing facility, a foley catheter was placed and labs and CXR were ordered However his fever did not improve and was sent for evaluation He is a full code per nurse from [facility] and sister who is at bedside  Past Medical History  Diagnosis Date  . Seizures   . Hypertension   . Diabetes mellitus   . Muscle weakness   . Dementia     History reviewed. No pertinent past surgical history.  History reviewed. No pertinent family history.  History  Substance Use Topics  . Smoking status: Not on file  . Smokeless tobacco: Not on file  . Alcohol Use: No      Review of Systems  Unable to perform ROS: Mental status change  Constitutional: Positive for fever.  Psychiatric/Behavioral: Positive for confusion.    Allergies  Review of patient's allergies indicates no known allergies.  Home Medications   Current Outpatient Rx  Name  Route  Sig  Dispense  Refill  . acetaminophen (TYLENOL) 650 MG CR tablet   Oral   Take 650 mg by mouth every 4 (four) hours as needed. pain         . aspirin 81 MG tablet   Oral   Take 81 mg by mouth daily.         . divalproex  (DEPAKOTE ER) 500 MG 24 hr tablet   Oral   Take 500 mg by mouth every 8 (eight) hours. Given at 600,1400,2200         . famotidine (PEPCID) 20 MG tablet   Oral   Take 10 mg by mouth daily at 6 (six) AM.          . glipiZIDE (GLUCOTROL XL) 5 MG 24 hr tablet   Oral   Take 5 mg by mouth daily.         Marland Kitchen LORazepam (ATIVAN) 0.5 MG tablet   Oral   Take 0.5 mg by mouth every 8 (eight) hours. For epilepsy given at 0600,1400,2200         . magnesium hydroxide (MILK OF MAGNESIA) 400 MG/5ML suspension   Oral   Take 30 mLs by mouth daily as needed. constipation         . metFORMIN (GLUCOPHAGE) 500 MG tablet   Oral   Take 500 mg by mouth 2 (two) times daily with a meal.         . mirtazapine (REMERON) 30 MG tablet   Oral   Take 30 mg by mouth at bedtime.         . Oxcarbazepine (TRILEPTAL) 300 MG tablet   Oral   Take 300 mg  by mouth 2 (two) times daily.         . phenytoin (DILANTIN) 100 MG ER capsule   Oral   Take 200 mg by mouth 2 (two) times daily.          . promethazine (PHENERGAN) 12.5 MG suppository   Rectal   Place 12.5 mg rectally every 6 (six) hours as needed. nausea         . promethazine (PHENERGAN) 12.5 MG tablet   Oral   Take 12.5 mg by mouth every 6 (six) hours as needed. nausea         . senna-docusate (SENOKOT-S) 8.6-50 MG per tablet   Oral   Take 1 tablet by mouth daily.         . sertraline (ZOLOFT) 50 MG tablet   Oral   Take 50 mg by mouth 2 (two) times daily.            BP 129/92  Pulse 129  Temp(Src) 102.8 F (39.3 C) (Rectal)  Resp 21  Ht 6' (1.829 m)  Wt 215 lb (97.523 kg)  BMI 29.15 kg/m2  SpO2 95%  Physical Exam CONSTITUTIONAL: chronically ill, disheveled HEAD: Normocephalic/atraumatic EYES: EOMI ENMT: Mucous membranes dry NECK: supple no meningeal signs ZO:[id number], S1/S2 noted, no murmurs/rubs/gallops noted LUNGS: mild tachypnea, coarse BS noted bilaterally ABDOMEN: soft, nontender, no rebound or  guarding JX:BJYNW catheter in place (present on arrival to the ER) no erythema/swelling to groin There is urine in foley bag NEURO: Pt is awake/alert but he does not follow commands and appears confused EXTREMITIES: pulses normal SKIN: warm, no sacral wounds per nursing PSYCH: confused  ED Course  Procedures   CRITICAL CARE Performed by: Joya Gaskins   Total critical care time: 36  Critical care time was exclusive of separately billable procedures and treating other patients.  Critical care was necessary to treat or prevent imminent or life-threatening deterioration.  Critical care was time spent personally by me on the following activities: development of treatment plan with patient and/or surrogate as well as nursing, discussions with consultants, evaluation of patient's response to treatment, examination of patient, obtaining history from patient or surrogate, ordering and performing treatments and interventions, ordering and review of laboratory studies, ordering and review of radiographic studies, pulse oximetry and re-evaluation of patient's condition.   Labs Reviewed  CULTURE, BLOOD (ROUTINE X 2)  CULTURE, BLOOD (ROUTINE X 2)  URINE CULTURE  URINALYSIS, ROUTINE W REFLEX MICROSCOPIC  LACTIC ACID, PLASMA   1:04 AM Pt had imaging and labs done earlier tonight at [facility] Sodium at 150, creat at 1.47 WBC at 15 CXR report is read as negative PTN level elevated at 22.2 Pt with sepsis, suspect urine as source Blood cultures/lactate ordered He has already been given Rocephin at [facility] Will folllow closely 1:28 AM On reassessment, pt has had improvement in HR, his SBP >130 Vancomycin has been ordered 2:05 AM D/w dr Orvan Falconer, will admit to stepdown Pt resting comfortably  MDM  Nursing notes including past medical history and social history reviewed and considered in documentation Labs/vital reviewed and considered Previous records reviewed and considered = recent ED  visit reviewed        Date: 09/09/2012  Rate: 116  Rhythm: sinus tachycardia  QRS Axis: left  Intervals: normal  ST/T Wave abnormalities: nonspecific ST changes  Conduction Disutrbances:none  Narrative Interpretation:   Old EKG Reviewed: unchanged    Joya Gaskins, MD 09/09/12 [phone]

## 2012-09-09 NOTE — ED Notes (Signed)
Last tylenol at 1952.

## 2012-09-09 NOTE — Clinical Social Work Psychosocial (Signed)
    Clinical Social Work Department BRIEF PSYCHOSOCIAL ASSESSMENT 09/09/2012  Patient:  [name], [name]     Account Number:  [id number]     Admit date:  09/09/2012  Clinical Social Worker:  Santa Genera, CLINICAL SOCIAL WORKER  Date/Time:  09/09/2012 11:00 AM  Referred by:  Physician  Date Referred:  09/09/2012 Referred for  SNF Placement   Other Referral:   Interview type:  Other - See comment Other interview type:   Spoke w facility admissions and family, patient not oriented for assessment    PSYCHOSOCIAL DATA Living Status:  FACILITY Admitted from facility:  [facility] Level of care:  Skilled Nursing Facility Primary support name:  [name] Primary support relationship to patient:  SIBLING Degree of support available:   Significant    CURRENT CONCERNS Current Concerns  Post-Acute Placement   Other Concerns:    SOCIAL WORK ASSESSMENT / PLAN CSW could not assess patient directly, patient not oriented at this time.  Spoke w admissions at [facility].  Patient admitted there in 2009, placed from [facility]. Patient had frequent falls at ALF and required higher level of care.  Per [facility], patient usually ambulates in wheelchair, cannot walk w walker or independently. Requires assistance w ADLs at present, approx one month ago was able to "brush teeth, get up to go to the bathrooom, comb hair."  Over the past month, patient has been increasingly weak and bed bound.  Facility says that patient may have mental retardation, and does suffer from seizures/epilepsy.  Says patient has had 3 grand mal seizures over past three weeks.  Patient's sister, [name], is very involved w patient and is the responsible party for the facility.  RN CM spoke w sister, who confirmed that plan is for patient to return to [facility] at discharge.  Facility is willing to take patient at discharge.   Assessment/plan status:  Psychosocial Support/Ongoing Assessment of Needs Other assessment/  plan:   Information/referral to community resources:   None needed    PATIENT'S/FAMILY'S RESPONSE TO PLAN OF CARE: Facility and sister willing to work w CSW on discharge planning.        Santa Genera, LCSW Clinical Social Worker [phone])

## 2012-09-09 NOTE — Progress Notes (Cosign Needed)
[name], HOHENSEE                 ACCOUNT NO.:  [id number]  MEDICAL RECORD NO.:  [id number]  LOCATION:  IC08                          FACILITY:  [facility]  PHYSICIAN:  Cheree Fowles G. Renard Matter, MD   DATE OF BIRTH:  14-Mar-1951  DATE OF PROCEDURE: DATE OF DISCHARGE:                                PROGRESS NOTE   SUBJECTIVE:  This patient resides in local nursing facility.  He had poor appetite for approximately 3 weeks.  He lost about 20 pounds and was extremely weak, falling out of a wheelchair, unable to sit up in bed unassisted.  He was noted to have elevated fever on admission to emergency department and thought to have possibly urosepsis and was admitted with this diagnosis for intravenous antibiotics and further assessment.  He is a diabetic and has hypertension and seizure disorder as well.  He was thought to have Dilantin toxicity and dehydration.  OBJECTIVE:  VITAL SIGNS:  Blood pressure 143/91, respirations 18, pulse 108, temp 101. HEENT:  PERRLA.  TMs negative.  Oropharynx benign. NECK:  Supple.  No JVD or thyroid abnormalities. HEART:  Regular rhythm.  No murmurs. LUNGS:  Clear to P and A. ABDOMEN:  No palpable organs or masses. EXTREMITIES:  Edema. NEUROLOGICAL:  No focal deficit.  LABORATORY DATA:  WBC 12,400 with hemoglobin 14.9, hematocrit 45.1. Urinalysis. 7-10, WBCs, 11-20 RBCs.  ASSESSMENT:  The patient admitted with sepsis, urinary tract infection, encephalopathy, hypernatremia, dehydration, Dilantin toxicity.  PLAN:  To continue currently prescribed antibiotic regimen which is Maxipime 2 g IV q.12 hours, Levaquin 750 mg every 24 hours.  The patient will be placed on Keppra 500 mg b.i.d.     Takuya Lariccia G. Renard Matter, MD     AGM/MEDQ  D:  09/09/2012  T:  09/09/2012  Job:  [id number]

## 2012-09-09 NOTE — Progress Notes (Signed)
INITIAL NUTRITION ASSESSMENT  DOCUMENTATION CODES Per approved criteria  -Not Applicable   INTERVENTION: Magic cup TID between meals, each supplement provides 290 kcal and 9 grams of protein.  NUTRITION DIAGNOSIS: Inadequate oral intake related to increased lethargy as evidenced by pt hx poor po and sleeping through meals, dehydration on admission.   Goal: Pt to meet >/= 90% of their estimated nutrition needs  Monitor:  Meal and supplement intake, follow wt changes and labs  Reason for Assessment: Malnutrition Screen   62 y.o. male  Admitting Dx: Sepsis  ASSESSMENT: Pt sister at bedside today. She reports he has been eating poorly and losing wt since after Christmas. According to hospital records his wt has decreased ~45#. Questionable amount of wt  loss given pt weight was 180# in mid-October last year.  Attempted to contact RN at [facility] to verify wt change but have been unable to talk with her at this point. Sister says that pt has been very sleepy lately and unable to wake him for meals. He was dehydrated and septic at admission. He is at risk for malnutrition given his poor oral intake and increased nutrition needs with sepsis.  Height: Ht Readings from Last 1 Encounters:  09/09/12 6' (1.829 m)    Weight: Wt Readings from Last 1 Encounters:  09/09/12 167 lb 15.9 oz (76.2 kg)    Ideal Body Weight: 178# (80.9 kg)  % Ideal Body Weight: 94%  Wt Readings from Last 10 Encounters:  09/09/12 167 lb 15.9 oz (76.2 kg)  08/11/12 215 lb (97.523 kg)  07/10/12 215 lb (97.523 kg)  06/10/12 200 lb 3.2 oz (90.81 kg)  05/03/12 180 lb (81.647 kg)    Usual Body Weight: 180# (4 months ago)  % Usual Body Weight: 93%  BMI:  Body mass index is 22.78 kg/(m^2). within normal range  Estimated Nutritional Needs: Kcal: 2025-2430 Protein:105-121 gr Fluid: > 2000 ml/day  Skin: no new issues  Diet Order: General  EDUCATION NEEDS: -No education needs identified at this  time   Intake/Output Summary (Last 24 hours) at 09/09/12 1608 Last data filed at 09/09/12 1500  Gross per 24 hour  Intake 2200.83 ml  Output    150 ml  Net 2050.83 ml    Last BM: PTA  Labs:   Recent Labs Lab 09/09/12 0459 09/09/12 1125  NA 156* 151*  K 3.9 3.6  CL 117* 114*  CO2 28 28  BUN 35* 29*  CREATININE 1.48* 1.12  CALCIUM 8.4 8.5  MG 2.2  --   PHOS 4.6  --   GLUCOSE 166* 161*    CBG (last 3)   Recent Labs  09/09/12 0444 09/09/12 0730 09/09/12 1126  GLUCAP 162* 153* 147*    Scheduled Meds: . aspirin  300 mg Rectal Daily  . ceFEPime (MAXIPIME) IV  1 g Intravenous Q8H  . heparin  5,000 Units Subcutaneous Q8H  . insulin aspart  0-15 Units Subcutaneous Q4H  . levETIRAcetam  500 mg Intravenous Q12H  . levofloxacin (LEVAQUIN) IV  750 mg Intravenous Q24H  . pantoprazole (PROTONIX) IV  40 mg Intravenous Q24H  . sodium chloride  3 mL Intravenous Q12H  . vancomycin  1,000 mg Intravenous Q12H    Continuous Infusions: . dextrose 125 mL/hr at 09/09/12 1500    Past Medical History  Diagnosis Date  . Seizures   . Hypertension   . Diabetes mellitus   . Muscle weakness   . Dementia     History reviewed. No pertinent  past surgical history.  Royann Shivers MS,RD,LDN,CSG Office: [phone] Pager: [phone]

## 2012-09-09 NOTE — ED Notes (Signed)
Patient from [facility] with decreased urine output, fever, tachycardia, and elevated WBCs. Per nursing home, chest xray today was negative.

## 2012-09-09 NOTE — Progress Notes (Signed)
Patient has are been seen by Dr. Renard Matter. Discussed the case with him. He has previously been Mr. Fitzgibbons' primary care provider. He will serve as the attending. Will change attending in computer.

## 2012-09-09 NOTE — Progress Notes (Signed)
ANTIBIOTIC CONSULT NOTE - INITIAL  Pharmacy Consult for Vancomycin, Levaquin, Cefepime Indication: sepsis  No Known Allergies  Patient Measurements: Height: 6' (182.9 cm) Weight: 167 lb 15.9 oz (76.2 kg) IBW/kg (Calculated) : 77.6  Vital Signs: Temp: 98.9 F (37.2 C) (02/24 0400) Temp src: Axillary (02/24 0400) BP: 123/91 mmHg (02/24 0600) Pulse Rate: 108 (02/24 0600) Intake/Output from previous day: 02/23 0701 - 02/24 0700 In: 570.8 [I.V.:520.8; IV Piggyback:50] Out: 150 [Urine:150] Intake/Output from this shift:    Labs:  Recent Labs  09/09/12 0459  WBC 12.4*  HGB 14.9  PLT 207  CREATININE 1.48*   Estimated Creatinine Clearance: 57.9 ml/min (by C-G formula based on Cr of 1.48). No results found for this basename: VANCOTROUGH, Leodis Binet, VANCORANDOM, GENTTROUGH, GENTPEAK, GENTRANDOM, TOBRATROUGH, TOBRAPEAK, TOBRARND, AMIKACINPEAK, AMIKACINTROU, AMIKACIN,  in the last 72 hours   Microbiology: Recent Results (from the past 720 hour(s))  CULTURE, BLOOD (ROUTINE X 2)     Status: None   Collection Time    09/09/12 12:45 AM      Result Value Range Status   Specimen Description Blood RIGHT ANTECUBITAL   Final   Special Requests BOTTLES DRAWN AEROBIC AND ANAEROBIC 5CC   Final   Culture PENDING   Incomplete   Report Status PENDING   Incomplete  CULTURE, BLOOD (ROUTINE X 2)     Status: None   Collection Time    09/09/12 12:50 AM      Result Value Range Status   Specimen Description Blood BLOOD LEFT HAND DRAWN BY RN [facility]   Final   Special Requests BOTTLES DRAWN AEROBIC AND ANAEROBIC 6CC   Final   Culture PENDING   Incomplete   Report Status PENDING   Incomplete    Medical History: Past Medical History  Diagnosis Date  . Seizures   . Hypertension   . Diabetes mellitus   . Muscle weakness   . Dementia     Medications:  Scheduled:  . [COMPLETED] sodium chloride  1,000 mL Intravenous Once   Followed by  . [COMPLETED] sodium chloride  1,000 mL Intravenous Once   . [COMPLETED] acetaminophen  650 mg Rectal Once  . aspirin  300 mg Rectal Daily  . ceFEPime (MAXIPIME) IV  1 g Intravenous Q8H  . heparin  5,000 Units Subcutaneous Q8H  . insulin aspart  0-15 Units Subcutaneous Q4H  . levETIRAcetam  500 mg Intravenous Q12H  . levofloxacin (LEVAQUIN) IV  750 mg Intravenous Q24H  . pantoprazole (PROTONIX) IV  40 mg Intravenous Q24H  . sodium chloride  3 mL Intravenous Q12H  . [COMPLETED] vancomycin  1,000 mg Intravenous Once  . vancomycin  1,000 mg Intravenous Q12H  . [DISCONTINUED] ceFEPime (MAXIPIME) IV  2 g Intravenous Q12H  . [DISCONTINUED] levETIRAcetam  500 mg Oral BID  . [DISCONTINUED] pantoprazole  40 mg Oral Daily   Assessment: 62yo male that is nursing home confined and mobile only by wheelchair.  Pt fell out of his wheelchair ~ 10 days ago and has been more lethargic.  Presented to ED for evaluation.  Pt has good renal fxn.  Estimated Creatinine Clearance: 57.9 ml/min (by C-G formula based on Cr of 1.48).  Pt received initial doses of ABX in ED.  Goal of Therapy:  Vancomycin trough level 15-20 mcg/ml  Plan: Vancomycin 1gm IV q12hrs Check trough at steady state Levaquin 750mg  IV q24hrs Cefepime 1gm IV q8hrs Monitor labs, renal fxn, and cultures per protocol Duration of therapy per MD  Valrie Hart A 09/09/2012,7:49  AM

## 2012-09-10 ENCOUNTER — Encounter (HOSPITAL_COMMUNITY): Payer: Self-pay | Admitting: *Deleted

## 2012-09-10 LAB — CBC
MCV: 91 fL (ref 78.0–100.0)
Platelets: 179 10*3/uL (ref 150–400)
RBC: 4.43 MIL/uL (ref 4.22–5.81)
WBC: 9 10*3/uL (ref 4.0–10.5)

## 2012-09-10 LAB — GLUCOSE, CAPILLARY
Glucose-Capillary: 175 mg/dL — ABNORMAL HIGH (ref 70–99)
Glucose-Capillary: 167 mg/dL — ABNORMAL HIGH (ref 70–99)

## 2012-09-10 LAB — BASIC METABOLIC PANEL
CO2: 30 mEq/L (ref 19–32)
Calcium: 8.7 mg/dL (ref 8.4–10.5)
GFR calc non Af Amer: 89 mL/min — ABNORMAL LOW (ref >90)
Potassium: 3.3 mEq/L — ABNORMAL LOW (ref 3.5–5.1)
Sodium: 146 mEq/L — ABNORMAL HIGH (ref 135–145)

## 2012-09-10 LAB — URINE CULTURE

## 2012-09-10 LAB — PHENYTOIN LEVEL, TOTAL: Phenytoin Lvl: 12.7 ug/mL (ref 10.0–20.0)

## 2012-09-10 MED ORDER — INSULIN ASPART 100 UNIT/ML ~~LOC~~ SOLN
0.0000 [IU] | Freq: Three times a day (TID) | SUBCUTANEOUS | Status: DC
  Administered 2012-09-10: 3 [IU] via SUBCUTANEOUS
  Administered 2012-09-10: 2 [IU] via SUBCUTANEOUS
  Administered 2012-09-10: 3 [IU] via SUBCUTANEOUS
  Administered 2012-09-11: 5 [IU] via SUBCUTANEOUS
  Administered 2012-09-11 (×2): 3 [IU] via SUBCUTANEOUS
  Administered 2012-09-12: 2 [IU] via SUBCUTANEOUS
  Administered 2012-09-12: 3 [IU] via SUBCUTANEOUS
  Administered 2012-09-12: 2 [IU] via SUBCUTANEOUS
  Administered 2012-09-12: 11 [IU] via SUBCUTANEOUS
  Administered 2012-09-13 (×2): 2 [IU] via SUBCUTANEOUS
  Administered 2012-09-13 (×2): 3 [IU] via SUBCUTANEOUS
  Administered 2012-09-14: 2 [IU] via SUBCUTANEOUS

## 2012-09-10 MED ORDER — SODIUM CHLORIDE 0.9 % IV SOLN
INTRAVENOUS | Status: DC
  Administered 2012-09-10 – 2012-09-13 (×7): via INTRAVENOUS

## 2012-09-10 MED ORDER — POTASSIUM CHLORIDE 10 MEQ/100ML IV SOLN
10.0000 meq | INTRAVENOUS | Status: AC
  Administered 2012-09-10 (×3): 10 meq via INTRAVENOUS
  Filled 2012-09-10: qty 100
  Filled 2012-09-10: qty 200

## 2012-09-10 NOTE — Progress Notes (Signed)
Report called and given to Loletta Specter, RN. Patient alert and in stable condition at the time of transport.

## 2012-09-10 NOTE — Progress Notes (Signed)
UR Chart Review Completed  

## 2012-09-10 NOTE — Clinical Social Work Note (Signed)
Chart reviewed, Admissions at [facility] updated on patient status.   Santa Genera, LCSW Clinical Social Worker [phone])

## 2012-09-10 NOTE — Progress Notes (Signed)
[name], [name]                 ACCOUNT NO.:  [id number]  MEDICAL RECORD NO.:  [id number]  LOCATION:  IC08                          FACILITY:  [facility]  PHYSICIAN:  Salil Raineri G. Renard Matter, MD   DATE OF BIRTH:  07-06-1951  DATE OF PROCEDURE: DATE OF DISCHARGE:                                PROGRESS NOTE   This patient appears more alert today.  He has had no seizures.  He remains on IV fluids.  He was admitted with what it was felt to be sepsis secondary to urinary tract infection, encephalopathy, hypernatremia, dehydration and Dilantin toxicity.  His blood cultures thus far are negative.  OBJECTIVE:  VITAL SIGNS:  Blood pressure 102/71, respirations 9-24, pulse 86, temp 97.5. HEENT:  PERRLA.  TM negative.  Oropharynx benign. NECK:  Supple.  No JVD or thyroid abnormalities. HEART:  Regular rhythm.  No murmurs. LUNGS:  Clear to P and A. ABDOMEN:  No palpable organs or masses. EXTREMITIES:  Free of edema. NEUROLOGIC:  No focal deficit.  LABORATORY DATA:  His most recent chemistries are essentially normal with exception of a low serum potassium at 3.3.  ASSESSMENT:  The patient was admitted with what was felt to be sepsis secondary to urinary tract infection, encephalopathy, hypernatremia, dehydration, Dilantin toxicity.  He does have a low serum potassium.  PLAN:  To replete his potassium.  Continue his current regimen which is Maxipime 2 g IV q.12 hours, Levaquin 750 mg every 24 hours, and Keppra 500 mg b.i.d.  His potassium will be repleted.     [name] G. Renard Matter, MD     AGM/MEDQ  D:  09/10/2012  T:  09/10/2012  Job:  [id number]

## 2012-09-11 LAB — GLUCOSE, CAPILLARY
Glucose-Capillary: 174 mg/dL — ABNORMAL HIGH (ref 70–99)
Glucose-Capillary: 211 mg/dL — ABNORMAL HIGH (ref 70–99)

## 2012-09-11 LAB — VANCOMYCIN, TROUGH: Vancomycin Tr: 24.7 ug/mL — ABNORMAL HIGH (ref 10.0–20.0)

## 2012-09-11 MED ORDER — LEVETIRACETAM 500 MG PO TABS
500.0000 mg | ORAL_TABLET | Freq: Two times a day (BID) | ORAL | Status: DC
  Administered 2012-09-11 – 2012-09-14 (×6): 500 mg via ORAL
  Filled 2012-09-11 (×6): qty 1

## 2012-09-11 MED ORDER — MUPIROCIN 2 % EX OINT
1.0000 | TOPICAL_OINTMENT | Freq: Two times a day (BID) | CUTANEOUS | Status: DC
Start: 2012-09-11 — End: 2012-09-14
  Administered 2012-09-11 – 2012-09-14 (×7): 1 via NASAL
  Filled 2012-09-11 (×3): qty 22

## 2012-09-11 MED ORDER — LEVOFLOXACIN 750 MG PO TABS
750.0000 mg | ORAL_TABLET | Freq: Every day | ORAL | Status: DC
  Administered 2012-09-12 – 2012-09-14 (×3): 750 mg via ORAL
  Filled 2012-09-11 (×4): qty 1

## 2012-09-11 MED ORDER — ASPIRIN 81 MG PO CHEW
81.0000 mg | CHEWABLE_TABLET | Freq: Every day | ORAL | Status: DC
  Administered 2012-09-12 – 2012-09-14 (×3): 81 mg via ORAL
  Filled 2012-09-11 (×3): qty 1

## 2012-09-11 MED ORDER — PANTOPRAZOLE SODIUM 40 MG PO TBEC
40.0000 mg | DELAYED_RELEASE_TABLET | Freq: Every day | ORAL | Status: DC
  Administered 2012-09-11 – 2012-09-14 (×4): 40 mg via ORAL
  Filled 2012-09-11 (×4): qty 1

## 2012-09-11 MED ORDER — CHLORHEXIDINE GLUCONATE CLOTH 2 % EX PADS
6.0000 | MEDICATED_PAD | Freq: Every day | CUTANEOUS | Status: DC
  Administered 2012-09-11 – 2012-09-14 (×3): 6 via TOPICAL

## 2012-09-11 MED ORDER — SODIUM CHLORIDE 0.9 % IV SOLN
1500.0000 mg | INTRAVENOUS | Status: DC
  Administered 2012-09-11 – 2012-09-12 (×2): 1500 mg via INTRAVENOUS
  Filled 2012-09-11 (×5): qty 1500

## 2012-09-11 NOTE — Progress Notes (Signed)
[name], Davidson                 ACCOUNT NO.:  [id number]  MEDICAL RECORD NO.:  [id number]  LOCATION:  A330                          FACILITY:  [facility]  PHYSICIAN:  Epimenio Schetter G. Renard Matter, MD   DATE OF BIRTH:  June 29, 1951  DATE OF PROCEDURE: DATE OF DISCHARGE:                                PROGRESS NOTE   SUBJECTIVE:  This patient continues to remain more alert.  He has had no further seizures.  He was admitted with sepsis secondary to UTI and encephalopathy, hyponatremia, dehydration, Dilantin toxicity, and hypokalemia.  He did have runs of KCl yesterday.  OBJECTIVE:  VITAL SIGNS:  Blood pressure 111/82, respirations 18, pulse 79, temperature 97.7. HEENT:  Eyes PERRLA.  TM negative.  Oropharynx benign. NECK:  Supple.  No JVD or thyroid abnormalities. HEART:  Regular rhythm.  No murmurs. LUNGS:  Clear to P and A. ABDOMEN:  No palpable organs or masses.  EXTREMITIES:  Free of edema. NEUROLOGIC:  No focal deficit.  ASSESSMENT:  The patient was admitted with what was felt to be sepsis secondary to urinary tract infection, encephalopathy, hypernatremia, dehydration, Dilantin toxicity.  We did replete his potassium. Yesterday his current BMET has not returned.  PLAN:  To continue IV Maxipime and IV Levaquin.  Keppra 500 mg b.i.d. His potassium will be repleted and continued.     Kanna Dafoe G. Renard Matter, MD     AGM/MEDQ  D:  09/11/2012  T:  09/11/2012  Job:  [id number]

## 2012-09-11 NOTE — Progress Notes (Signed)
ANTIBIOTIC CONSULT NOTE  Pharmacy Consult for Vancomycin, Levaquin, Cefepime Indication: sepsis  No Known Allergies  Patient Measurements: Height: 6' (182.9 cm) Weight: 144 lb 9.6 oz (65.59 kg) IBW/kg (Calculated) : 77.6  Vital Signs: Temp: 96.8 F (36 C) (02/26 1027) Temp src: Oral (02/26 1027) BP: 96/67 mmHg (02/26 1027) Pulse Rate: 78 (02/26 1027) Intake/Output from previous day: 02/25 0701 - 02/26 0700 In: 1113.3 [P.O.:120; I.V.:433.3; IV Piggyback:560] Out: 750 [Urine:750] Intake/Output from this shift: Total I/O In: 410 [P.O.:410] Out: -   Labs:  Recent Labs  09/09/12 0459 09/09/12 1125 09/10/12 0510  WBC 12.4*  --  9.0  HGB 14.9  --  13.4  PLT 207  --  179  CREATININE 1.48* 1.12 0.97   Estimated Creatinine Clearance: 76.1 ml/min (by C-G formula based on Cr of 0.97).  Recent Labs  09/11/12 0948  VANCOTROUGH 24.7*     Microbiology: Recent Results (from the past 720 hour(s))  URINE CULTURE     Status: None   Collection Time    09/09/12 12:37 AM      Result Value Range Status   Specimen Description URINE, CATHETERIZED   Final   Special Requests NONE   Final   Culture  Setup Time 09/09/2012 03:53   Final   Colony Count NO GROWTH   Final   Culture NO GROWTH   Final   Report Status 09/10/2012 FINAL   Final  CULTURE, BLOOD (ROUTINE X 2)     Status: None   Collection Time    09/09/12 12:45 AM      Result Value Range Status   Specimen Description BLOOD RIGHT ANTECUBITAL   Final   Special Requests BOTTLES DRAWN AEROBIC AND ANAEROBIC 5CC   Final   Culture NO GROWTH 2 DAYS   Final   Report Status PENDING   Incomplete  CULTURE, BLOOD (ROUTINE X 2)     Status: None   Collection Time    09/09/12 12:50 AM      Result Value Range Status   Specimen Description BLOOD LEFT HAND DRAWN BY RN RH   Final   Special Requests BOTTLES DRAWN AEROBIC AND ANAEROBIC 6CC   Final   Culture NO GROWTH 2 DAYS   Final   Report Status PENDING   Incomplete  MRSA PCR SCREENING      Status: Abnormal   Collection Time    09/09/12  3:05 AM      Result Value Range Status   MRSA by PCR POSITIVE (*) NEGATIVE Final   Comment:            The GeneXpert MRSA Assay (FDA     approved for NASAL specimens     only), is one component of a     comprehensive MRSA colonization     surveillance program. It is not     intended to diagnose MRSA     infection nor to guide or     monitor treatment for     MRSA infections.     RESULT CALLED TO, READ BACK BY AND VERIFIED WITH:     KOGER,L. AT 1406 ON 09/09/2012 BY BAUGHAM,M.    Medical History: Past Medical History  Diagnosis Date  . Seizures   . Hypertension   . Diabetes mellitus   . Muscle weakness   . Dementia     Medications:  Scheduled:  . aspirin  300 mg Rectal Daily  . ceFEPime (MAXIPIME) IV  1 g Intravenous Q8H  . Chlorhexidine  Gluconate Cloth  6 each Topical Q0600  . heparin  5,000 Units Subcutaneous Q8H  . insulin aspart  0-15 Units Subcutaneous TID WC & HS  . levETIRAcetam  500 mg Intravenous Q12H  . levofloxacin (LEVAQUIN) IV  750 mg Intravenous Q24H  . mupirocin ointment  1 application Nasal BID  . pantoprazole (PROTONIX) IV  40 mg Intravenous Q24H  . sodium chloride  3 mL Intravenous Q12H  . vancomycin  1,000 mg Intravenous Q12H   Assessment: 61yo M admitted from NH for urosepsis on day#3 empiric, broad-spectrum antibiotics with Vancomycin, Levaquin & Cefepime. Renal function has returned to baseline.  Vancomycin trough is slightly above goal range today.  Cx data is negative to date.  Patient has clinically improved.   Goal of Therapy:  Vancomycin trough level 15-20 mcg/ml  Plan: Decrease Vancomycin 1500mg  IV q24 Check weekly trough level Change Levaquin to 750mg  po q24hrs per P&T policy (see policy below) Cefepime 1gm IV q8hrs Monitor labs, renal fxn, and cultures  Duration of therapy per MD Change Protonix & Keppra to po per P&Tpolicy (see policy below)  Lenna Hagarty, [facility] 09/11/2012,10:58 AM  The patient is receiving Protonix, Keppra by the intravenous route.  Based on criteria approved by the Pharmacy and Therapeutics Committee and the Medical Executive Committee, the medication is being converted to the equivalent oral dose form.  These criteria include: -No Active GI bleeding -Able to tolerate diet of full liquids (or better) or tube feeding OR able to tolerate other medications by the oral or enteral route  If you have any questions about this conversion, please contact the Pharmacy Department (ext [phone]).  Thank you.  Elson Clan, [facility] 09/11/2012 11:08 AM  PHARMACIST - PHYSICIAN COMMUNICATION DR:   Renard Matter CONCERNING: Antibiotic IV to Oral Route Change Policy  RECOMMENDATION: This patient is receiving Levaquin by the intravenous route.  Based on criteria approved by the Pharmacy and Therapeutics Committee, the antibiotic(s) is/are being converted to the equivalent oral dose form(s).   DESCRIPTION: These criteria include:  Patient being treated for a respiratory tract infection, urinary tract infection, or cellulitis  The patient is not neutropenic and does not exhibit a GI malabsorption state  The patient is eating (either orally or via tube) and/or has been taking other orally administered medications for a least 24 hours  The patient is improving clinically and has a Tmax < 100.5  If you have questions about this conversion, please contact the Pharmacy Department  [x]   [phone] )  [facility] []   [phone] )  [facility]  []   [phone] )  [facility] []   [phone] )  [facility]

## 2012-09-12 LAB — BASIC METABOLIC PANEL
CO2: 25 mEq/L (ref 19–32)
CO2: 26 mEq/L (ref 19–32)
Calcium: 7.8 mg/dL — ABNORMAL LOW (ref 8.4–10.5)
Chloride: 103 mEq/L (ref 96–112)
Chloride: 106 mEq/L (ref 96–112)
Creatinine, Ser: 1 mg/dL (ref 0.50–1.35)
Glucose, Bld: 327 mg/dL — ABNORMAL HIGH (ref 70–99)
Potassium: 3.2 mEq/L — ABNORMAL LOW (ref 3.5–5.1)
Sodium: 139 mEq/L (ref 135–145)

## 2012-09-12 LAB — GLUCOSE, CAPILLARY

## 2012-09-12 MED ORDER — POTASSIUM CHLORIDE 10 MEQ/100ML IV SOLN
10.0000 meq | INTRAVENOUS | Status: AC
  Administered 2012-09-12 (×4): 10 meq via INTRAVENOUS
  Filled 2012-09-12 (×3): qty 100
  Filled 2012-09-12: qty 200

## 2012-09-12 NOTE — Clinical Social Work Note (Signed)
CSW placed signed copy of FL2 and MOST form on shadow chart.  Santa Genera, LCSW Clinical Social Worker [phone])

## 2012-09-12 NOTE — Progress Notes (Signed)
NAMELEONCE, BALE                 ACCOUNT NO.:  [id number]  MEDICAL RECORD NO.:  [id number]  LOCATION:  A330                          FACILITY:  [facility]  PHYSICIAN:  Brelan Hannen G. Renard Matter, MD   DATE OF BIRTH:  05-17-1951  DATE OF PROCEDURE: DATE OF DISCHARGE:                                PROGRESS NOTE   SUBJECTIVE:  This patient continues to remain more alert.  He has had no seizures.  He was admitted with sepsis secondary to UTI, encephalopathy, hyponatremia, dehydration, Dilantin toxicity, and hypokalemia.  OBJECTIVE:  VITAL SIGNS:  Blood pressure 123/68, respirations 16, pulse 83, temp 98.4. HEENT:  Eyes:  PERRLA.  TMs negative.  Oropharynx benign. NECK:  Supple.  No JVD or thyroid abnormalities. HEART:  Regular rhythm.  No murmurs. LUNGS:  Clear to P and A. ABDOMEN:  No palpable organs or masses. EXTREMITIES:  Free of edema.  ASSESSMENT:  The patient was admitted with what was felt to be sepsis secondary to urinary tract infection, encephalopathy, hyponatremia, dehydration, Dilantin toxicity, and hypokalemia.  PLAN:  To continue to replete his potassium.  Continue current IV antibiotics.  The patient should be able to be sent back to nursing facility tomorrow.     Clessie Karras G. Renard Matter, MD     AGM/MEDQ  D:  09/12/2012  T:  09/12/2012  Job:  [id number]

## 2012-09-13 LAB — BASIC METABOLIC PANEL
BUN: 9 mg/dL (ref 6–23)
Calcium: 8.7 mg/dL (ref 8.4–10.5)
GFR calc Af Amer: >90 mL/min (ref >90)
GFR calc non Af Amer: >90 mL/min (ref >90)
Glucose, Bld: 154 mg/dL — ABNORMAL HIGH (ref 70–99)
Potassium: 3.7 mEq/L (ref 3.5–5.1)
Sodium: 139 mEq/L (ref 135–145)

## 2012-09-13 LAB — GLUCOSE, CAPILLARY: Glucose-Capillary: 161 mg/dL — ABNORMAL HIGH (ref 70–99)

## 2012-09-13 MED ORDER — POTASSIUM CHLORIDE 10 MEQ/100ML IV SOLN
INTRAVENOUS | Status: AC
  Filled 2012-09-13: qty 400

## 2012-09-13 MED ORDER — POTASSIUM CHLORIDE 10 MEQ/100ML IV SOLN
10.0000 meq | INTRAVENOUS | Status: AC
  Administered 2012-09-13 (×4): 10 meq via INTRAVENOUS

## 2012-09-13 MED ORDER — LEVETIRACETAM 500 MG PO TABS: 500.0000 mg | ORAL_TABLET | Freq: Two times a day (BID) | ORAL | Status: DC

## 2012-09-13 NOTE — Progress Notes (Signed)
UR Chart Review Completed  
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
## 2012-09-13 NOTE — Progress Notes (Signed)
Case Manager informed nurse that she had spoken with Dr. Renard Matter and he had mentioned patient having runs of Potassium.  Nurse paged Dr. Renard Matter.  Dr. Renard Matter returned page and stated that he wanted the patient to have 4 bags of Potassium 10 meq/100 ml.  BMET after the 4th run is complete and call Dr. Renard Matter with the results.  Orders followed.

## 2012-09-13 NOTE — Progress Notes (Signed)
Pt was supposed to go to [facility] this evening.  Dr. Renard Matter stated that the discharge summary had been completed.  However, the d/c summary has not shown up in EPIC.  [facility] must receive the discharge summary prior to patient returning to the facility.  Dr. Renard Matter stated that the patient could wait and go tomorrow.  Nurse informed Dr. Renard Matter that the patient's foley catheter and IV had been removed.  Dr. Renard Matter stated that was fine to just leave out.  Pt's brother-in-law in the room and notified that the move would not be until tomorrow morning, since we were waiting for the discharge summary.  Pt's family stated that was ok.  Pt's brother-in-law stated that he had informed pt's sister, and she would be spending the night with the patient tonight.  Oncoming nurse, Helmut Muster, notified to watch for the discharge summary.  Provided with the number for Debbie and [facility] and [place], SW, at [facility]

## 2012-09-14 LAB — BASIC METABOLIC PANEL
Calcium: 9.1 mg/dL (ref 8.4–10.5)
GFR calc Af Amer: >90 mL/min (ref >90)
GFR calc non Af Amer: >90 mL/min (ref >90)
Glucose, Bld: 148 mg/dL — ABNORMAL HIGH (ref 70–99)
Potassium: 3.3 mEq/L — ABNORMAL LOW (ref 3.5–5.1)
Sodium: 140 mEq/L (ref 135–145)

## 2012-09-14 LAB — CULTURE, BLOOD (ROUTINE X 2)

## 2012-09-14 LAB — GLUCOSE, CAPILLARY: Glucose-Capillary: 138 mg/dL — ABNORMAL HIGH (ref 70–99)
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
## 2012-09-14 NOTE — Discharge Summary (Signed)
[name], [name]                 ACCOUNT NO.:  [id number]  MEDICAL RECORD NO.:  [id number]  LOCATION:  A330                          FACILITY:  [facility]  PHYSICIAN:  Thamas Appleyard G. Renard Matter, MD   DATE OF BIRTH:  [birth year]  DATE OF ADMISSION:  09/09/2012 DATE OF DISCHARGE:  LH                              DISCHARGE SUMMARY   ADDENDUM:  The patient was discharged on the following medications. Tylenol 650 mg every 4 hours as needed, aspirin 81 mg daily, Depakote ER 500 mg daily, Pepcid 20 mg daily, glipizide or Glucotrol XL 5 mg daily. The patient was stable at the time of his discharge.     Neill Jurewicz G. Renard Matter, MD     AGM/MEDQ  D:  09/13/2012  T:  09/14/2012  Job:  [id number]

## 2012-09-14 NOTE — Progress Notes (Signed)
Patient with orders to be discharge to [facility], skilled nursing facility. Report called to nurse at [facility]. Patient in stable condition upon discharge. EMS transported patient.

## 2012-09-14 NOTE — Discharge Summary (Signed)
[name], [name]                 ACCOUNT NO.:  [id number]  MEDICAL RECORD NO.:  [id number]  LOCATION:  A330                          FACILITY:  [facility]  PHYSICIAN:  Mazel Villela G. Kelcee Bjorn, MD   DATE OF BIRTH:  1950-09-05  DATE OF ADMISSION:  09/09/2012 DATE OF DISCHARGE:  LH                              DISCHARGE SUMMARY   ADDENDUM:  The patient should continue the following medications, Remeron 30 mg daily, Dilantin 100 mg t.i.d., Senokot 8.6/50 daily, Zoloft 50 mg daily, metformin 500 mg b.i.d.     Nalany Steedley G. Renard Matter, MD     AGM/MEDQ  D:  09/13/2012  T:  09/14/2012  Job:  [id number]

## 2012-10-10 ENCOUNTER — Ambulatory Visit (INDEPENDENT_AMBULATORY_CARE_PROVIDER_SITE_OTHER): Payer: Medicaid Other | Admitting: Gastroenterology

## 2012-10-10 ENCOUNTER — Encounter: Payer: Self-pay | Admitting: Gastroenterology

## 2012-10-10 VITALS — BP 113/78 | HR 92 | Temp 97.5°F | Ht 72.0 in

## 2012-10-10 DIAGNOSIS — R634 Abnormal weight loss: Secondary | ICD-10-CM

## 2012-10-10 DIAGNOSIS — F5000 Anorexia nervosa, unspecified: Secondary | ICD-10-CM | POA: Insufficient documentation

## 2012-10-10 NOTE — Progress Notes (Signed)
Primary Care Physician:  Alice Reichert, MD  Primary Gastroenterologist:  Roetta Sessions, MD   Chief Complaint  Patient presents with  . Advice Only    possible peg placement    HPI:  [name] is a 62 y.o. male here for further evaluation of weight loss and poor appetite. He is accompanied by his guardian and sister, [name]. Patient is unable to provide any history. He had minimal communication. According to Ms. Talley, he has had gradual weight loss and loss of appetite. Some days he eats well and others he barely eats. She has not witnessed him have any difficulty swallowing. No report of melena, brbpr, constipation, diarrhea. Patient denies abdominal pain. No prior TCS.Marland Kitchen Amount of weight loss unknown. Recently started Megace and Ms. Doylene Canard believes it may be helping some.   Current Outpatient Prescriptions  Medication Sig Dispense Refill  . acetaminophen (TYLENOL) 650 MG CR tablet Take 650 mg by mouth every 4 (four) hours as needed. pain      . aspirin 81 MG tablet Take 81 mg by mouth daily.      . divalproex (DEPAKOTE ER) 500 MG 24 hr tablet Take 500 mg by mouth every 8 (eight) hours. Given at 600,1400,2200      . famotidine (PEPCID) 10 MG tablet Take 10 mg by mouth daily.      Marland Kitchen glipiZIDE (GLUCOTROL XL) 5 MG 24 hr tablet Take 5 mg by mouth daily.      Marland Kitchen levETIRAcetam (KEPPRA) 500 MG tablet Take 1 tablet (500 mg total) by mouth 2 (two) times daily.  60 tablet  5  . LORazepam (ATIVAN) 0.5 MG tablet Take 0.5 mg by mouth every 8 (eight) hours. For epilepsy given at 0600,1400,2200      . magnesium hydroxide (MILK OF MAGNESIA) 400 MG/5ML suspension Take 30 mLs by mouth daily as needed. constipation      . megestrol (MEGACE ES) 625 MG/5ML suspension Take 625 mg by mouth daily.      . metFORMIN (GLUCOPHAGE) 500 MG tablet Take 500 mg by mouth 2 (two) times daily with a meal.      . mirtazapine (REMERON) 30 MG tablet Take 30 mg by mouth at bedtime.      . Multiple Vitamin  (MULTIVITAMIN) capsule Take 1 capsule by mouth daily.      . Oxcarbazepine (TRILEPTAL) 300 MG tablet Take 300 mg by mouth 2 (two) times daily.      . phenytoin (DILANTIN) 100 MG ER capsule Take 200 mg by mouth 2 (two) times daily.       Marland Kitchen senna-docusate (SENOKOT-S) 8.6-50 MG per tablet Take 1 tablet by mouth daily.      . sertraline (ZOLOFT) 50 MG tablet Take 100 mg by mouth 2 (two) times daily.        No current facility-administered medications for this visit.    Allergies as of 10/10/2012  . (No Known Allergies)    Past Medical History  Diagnosis Date  . Seizures   . Hypertension   . Diabetes mellitus   . Muscle weakness   . Dementia   . Oropharyngeal dysphagia   . GERD (gastroesophageal reflux disease)   . Hyperlipidemia     No past surgical history on file.  Family History  Problem Relation Age of Onset  . Throat cancer Brother   . Stroke Father   . Liver cancer Mother     History   Social History  . Marital Status: Widowed  Spouse Name: N/A    Number of Children: N/A  . Years of Education: N/A   Occupational History  . Resides at [facility] nursing home    Social History Main Topics  . Smoking status: Never Smoker   . Smokeless tobacco: Not on file  . Alcohol Use: No     Comment: prior remote heavy use  . Drug Use: No  . Sexually Active: Not on file   Other Topics Concern  . Not on file   Social History Narrative  . No narrative on file      ROS: Unable to obtain from patient.     Physical Examination:  BP 113/78  Pulse 92  Temp(Src) 97.5 F (36.4 C) (Oral)  Ht 6' (1.829 m)   General: Well-developed in no acute distress. Accompanied by sister. Does very little talking. Will not answer questions.  Appears much older than stated age.  Head: Normocephalic, atraumatic.   Eyes: Conjunctiva pink, no icterus. Mouth: Oropharyngeal mucosa moist and pink , no lesions erythema or exudate.poor dentition.  Neck: Supple without thyromegaly, masses, or  lymphadenopathy.  Lungs: Clear to auscultation bilaterally.  Heart: Regular rate and rhythm, no murmurs rubs or gallops.  Abdomen: Bowel sounds are normal, nontender, nondistended, no hepatosplenomegaly or masses, no abdominal bruits or    hernia , no rebound or guarding.   Rectal: not performed, patient in wheelchair Extremities: No lower extremity edema. No clubbing or deformities.  Neuro: Alert. Limited communication during visit.  Skin: Warm and dry, no rash or jaundice.   Psych: Alert. Easily agitated.  Labs: Lab Results  Component Value Date   CREATININE 0.87 09/14/2012   BUN 9 09/14/2012   NA 140 09/14/2012   K 3.3* 09/14/2012   CL 100 09/14/2012   CO2 32 09/14/2012   Lab Results  Component Value Date   WBC 9.0 09/10/2012   HGB 13.4 09/10/2012   HCT 40.3 09/10/2012   MCV 91.0 09/10/2012   PLT 179 09/10/2012   Lab Results  Component Value Date   ALT 13 09/09/2012   AST 16 09/09/2012   ALKPHOS 139* 09/09/2012   BILITOT 0.4 09/09/2012   Lab Results  Component Value Date   TSH 0.880 09/09/2012     Imaging Studies: No results found.

## 2012-10-10 NOTE — Patient Instructions (Addendum)
For staff at [facility]:  1. Send copy of weights from the last one year 2. Send copy of in the notes or previous evaluations from speech therapy

## 2012-10-11 ENCOUNTER — Encounter: Payer: Self-pay | Admitting: Gastroenterology
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
## 2012-10-14 NOTE — Progress Notes (Signed)
CC PCP 

## 2012-11-05 ENCOUNTER — Telehealth: Payer: Self-pay | Admitting: Gastroenterology

## 2012-11-05 NOTE — Telephone Encounter (Signed)
We never received records from nursing home regarding prior weights (last six months) and speech therapy evaluation.  Please obtain asap.

## 2012-11-06 NOTE — Telephone Encounter (Signed)
Sent request to [facility].

## 2012-11-11 NOTE — Telephone Encounter (Signed)
Received weight records from [facility]. Placed on [facility] cart.  Pt refused speech therapy.

## 2012-11-11 NOTE — Progress Notes (Signed)
Finally received records from [facility]. Received 3 years worth of weights on the patient.  December 2009, 172.6 pounds (standing) September 2010, 137.8 pounds January 2011, 162.8 pounds November 2011, 175.6 pounds July 2012, 185.6 pounds January 2013, 190.6 pounds July 2013, 202.4 pounds January 2014, 180.4 pounds February 2014, 178.6 pounds March 2014, 173.6 pounds April 2014, 168.12 pounds 11/05/2012, 164 pounds (bed)  Patient refused speech therapy evaluation.  I will discuss further with Dr. Jena Gauss. Consider EGD +/- PEG.

## 2012-11-20 ENCOUNTER — Telehealth: Payer: Self-pay | Admitting: Gastroenterology

## 2012-11-20 NOTE — Telephone Encounter (Signed)
Discussed with Dr. Jena Gauss regarding anorexia/weight loss.  Weights have really been all over the place. ?times of peripheral edema/fluid overload.   He recommends OV again here in next few weeks.  Let's have him weighed at [facility] and request they include his current weight with records they bring to office.   If ongoing weight loss, we can discuss with his sister regarding next step. May offer screening colonoscopy as well as EGD. Not clear that he needs a PEG quiet yet.

## 2012-11-27 NOTE — Telephone Encounter (Signed)
Susan, please schedule pt ov. 

## 2012-12-05 NOTE — Telephone Encounter (Signed)
Pt is aware of OV on 6/4 at 10am with [facility]

## 2012-12-16 NOTE — Telephone Encounter (Signed)
Faxed reminder letter to [facility], asked them to weigh pt prior to ov and include with pts paperwork.

## 2012-12-18 ENCOUNTER — Encounter: Payer: Self-pay | Admitting: Gastroenterology

## 2012-12-18 ENCOUNTER — Ambulatory Visit (INDEPENDENT_AMBULATORY_CARE_PROVIDER_SITE_OTHER): Payer: Medicaid Other | Admitting: Gastroenterology

## 2012-12-18 VITALS — BP 100/75 | HR 101 | Temp 97.8°F | Ht 73.0 in | Wt 165.0 lb

## 2012-12-18 DIAGNOSIS — R634 Abnormal weight loss: Secondary | ICD-10-CM

## 2012-12-18 NOTE — Assessment & Plan Note (Signed)
Weight has stabilized. Patient's sister, Ms. [name], is not interested in pursuing any further workup at this time. Discussed possibility of EGD to rule out ulcer, malignancy but she is not interested. At this time he does not need to have a feeding tube as he is maintaining his weight. Ms. Doylene Canard is not interested in a feeding tube. She will continue to monitor him and let us know if he has any ongoing weight loss or difficulties eating. We will have the nursing home send [place] the next 3 monthly weights for review. If he has any recurrence of symptoms would offer an upper endoscopy at that time. He will continue Pepcid for now.

## 2012-12-18 NOTE — Patient Instructions (Addendum)
1. Please call if [name]'s weight drops by five pounds or if you notice he is not eating as well again. 2. I will touch base with [facility] in two months regarding his weight.

## 2012-12-18 NOTE — Progress Notes (Signed)
Cc PCP 
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
## 2013-01-04 NOTE — Progress Notes (Signed)
REVIEWED.  

## 2013-06-13 IMAGING — CT CT HEAD W/O CM
1 of 2 series · 16 of 30 positions shown, 20 images · non-contrast
Comparison: Most recent 08/29/2012.

CLINICAL DATA: Mental retardation, seizures with increasing
frequency.  Difficulty with ambulation. History of hypertension and
diabetes.

CT HEAD WITHOUT CONTRAST
TECHNIQUE: Contiguous axial images were obtained from the base of
the skull through the vertex without contrast.

[Series 2: headseq 4.8 h37s · axial · 0.44mm/px · z∈[+121,+281]mm · 16 of 36 slices shown, 20 images]
[im 2/36  brain]
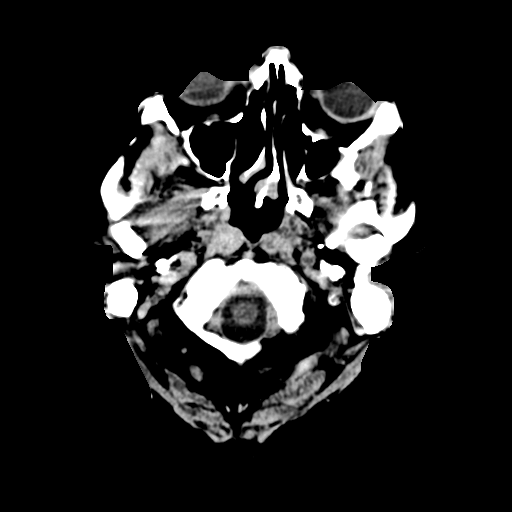
[im 2/36  bone]
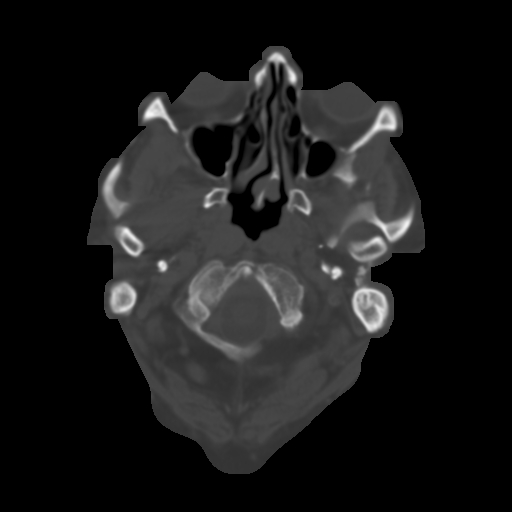
[im 4/36  brain]
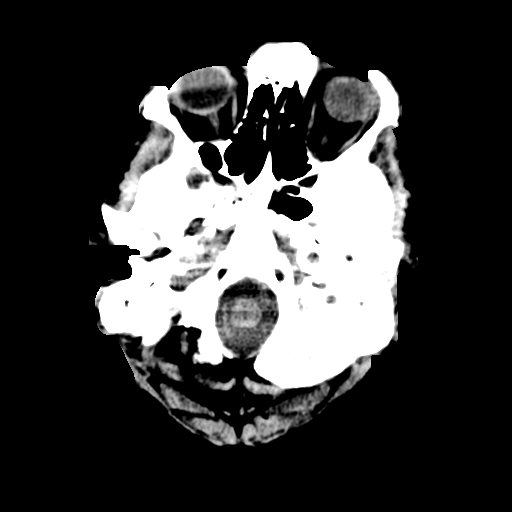
[im 7/36  brain]
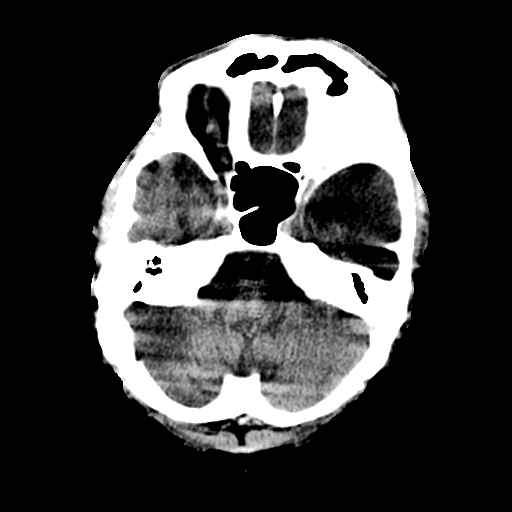
[im 9/36  brain]
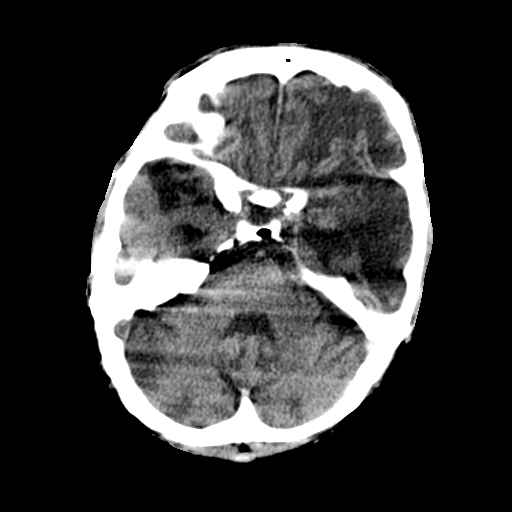
[im 11/36  brain]
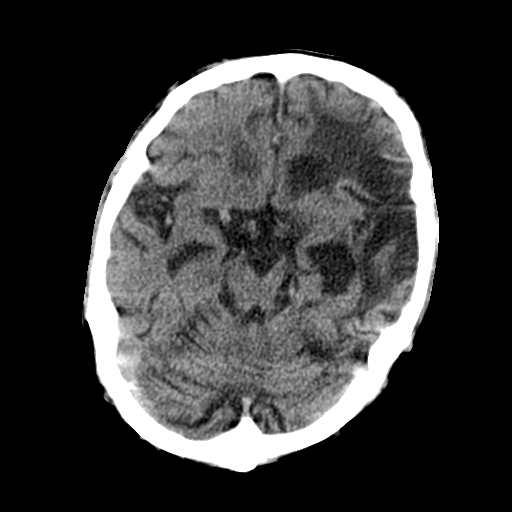
[im 11/36  bone]
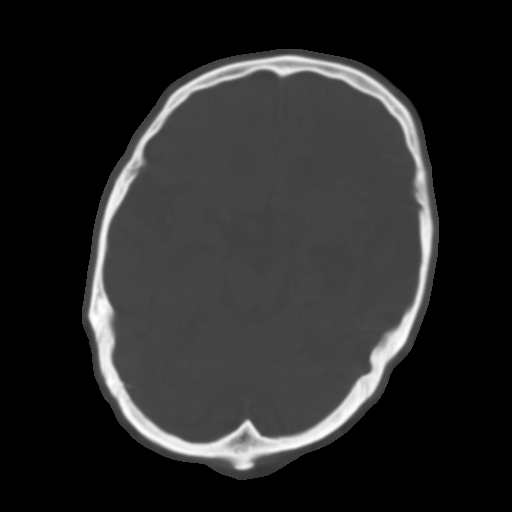
[im 12/36  brain]
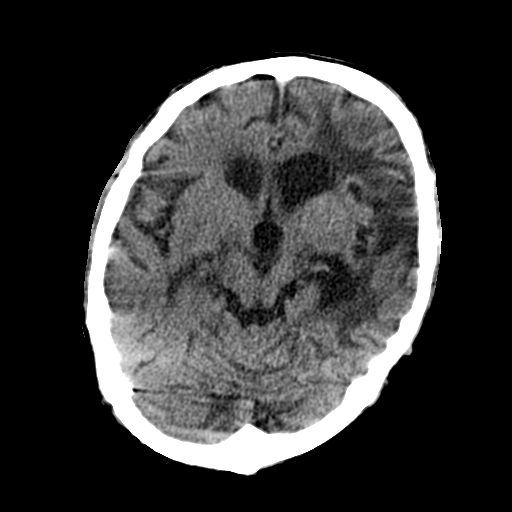
[im 16/36  brain]
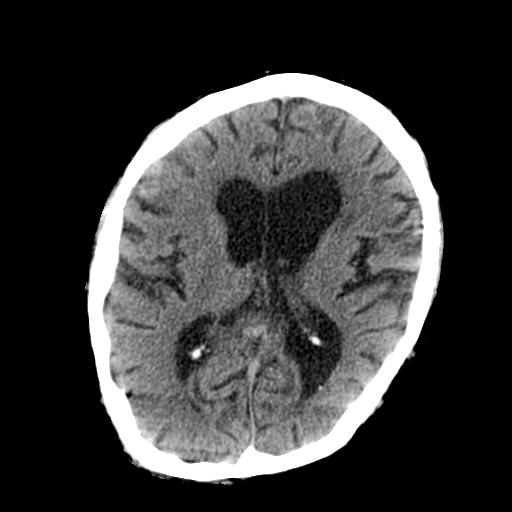
[im 17/36  brain]
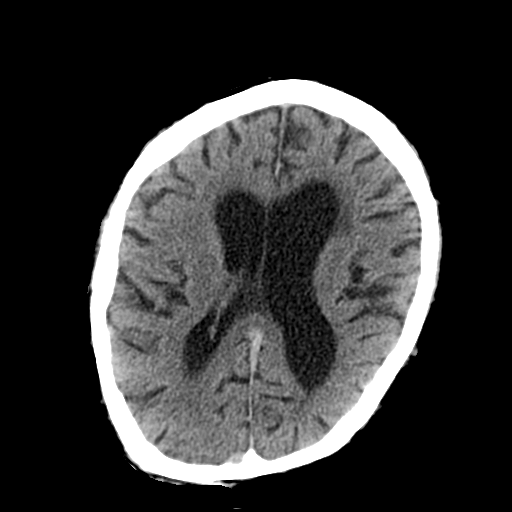
[im 19/36  brain]
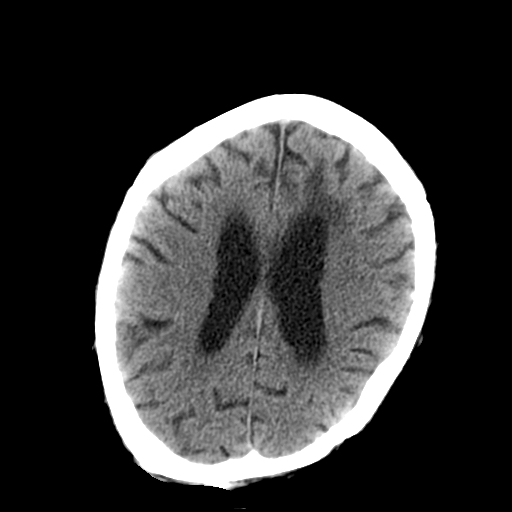
[im 19/36  bone]
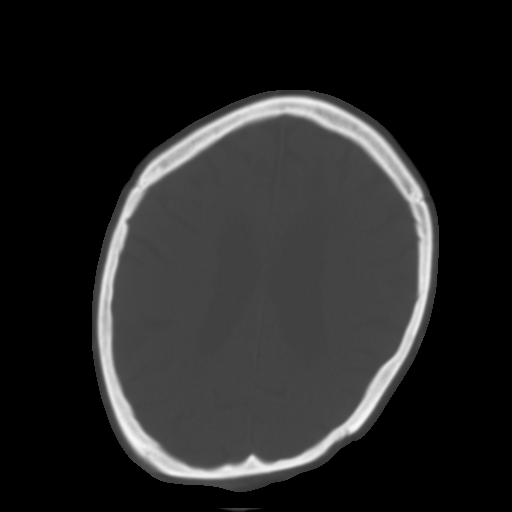
[im 21/36  brain]
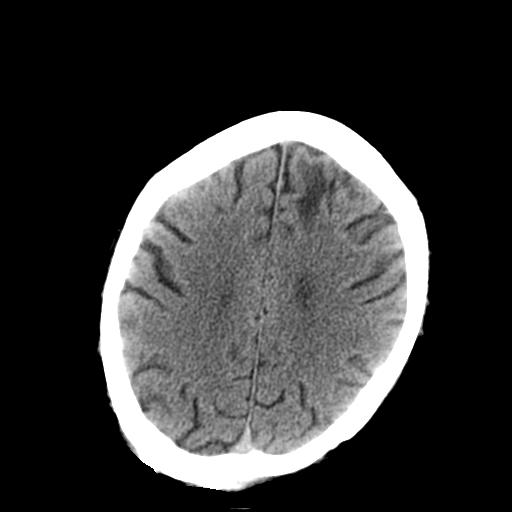
[im 24/36  brain]
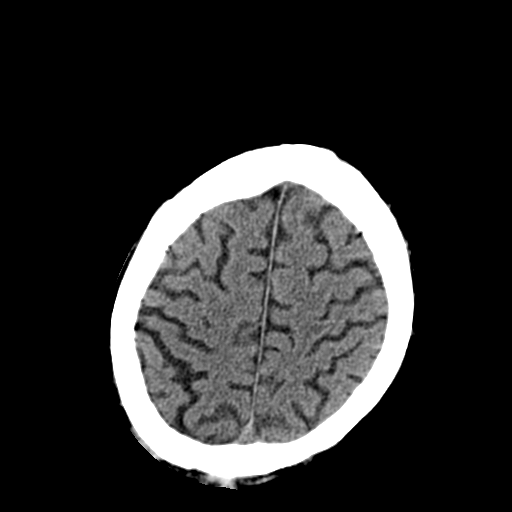
[im 26/36  brain]
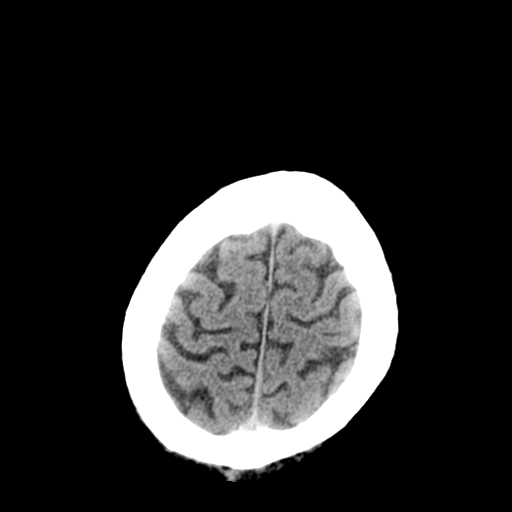
[im 27/36  brain]
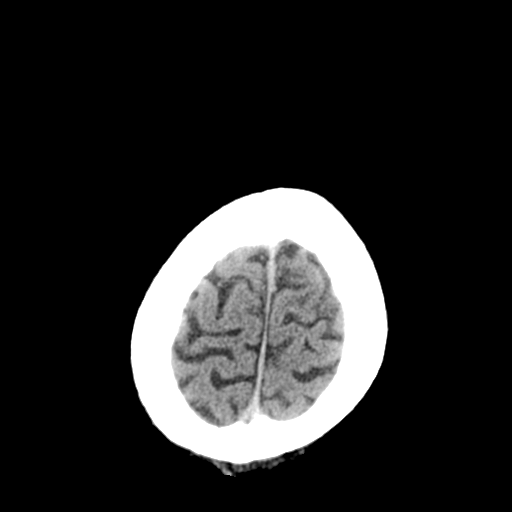
[im 27/36  bone]
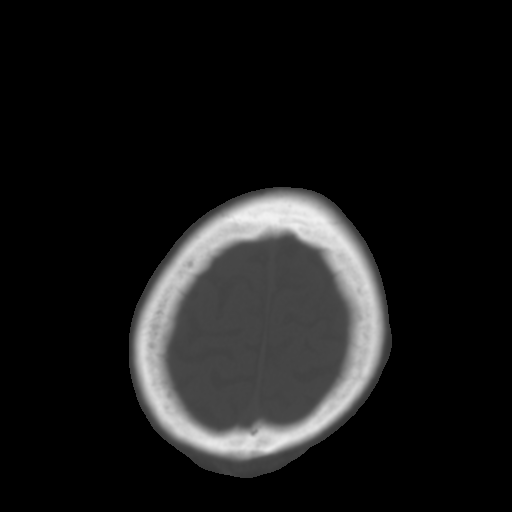
[im 29/36  brain]
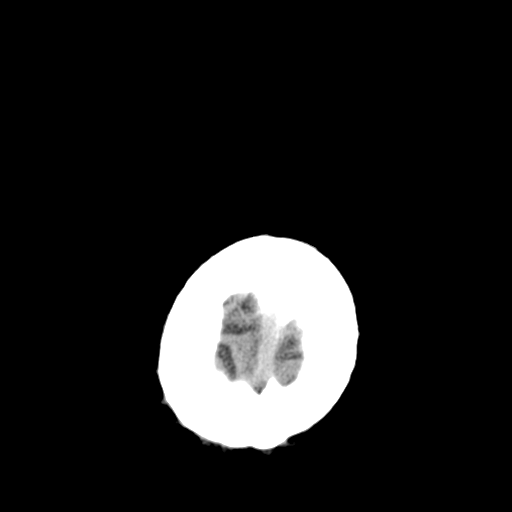
[im 32/36  brain]
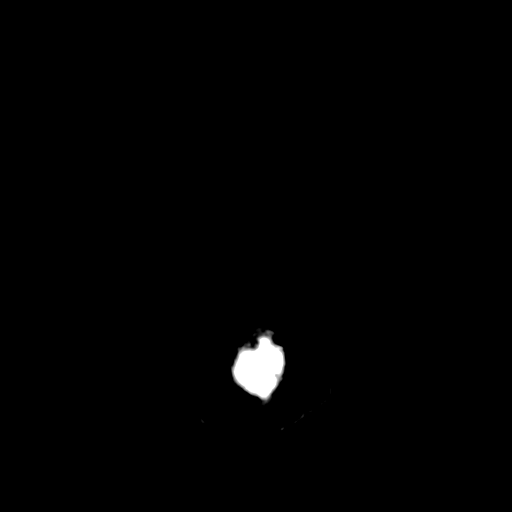
[im 34/36  brain]
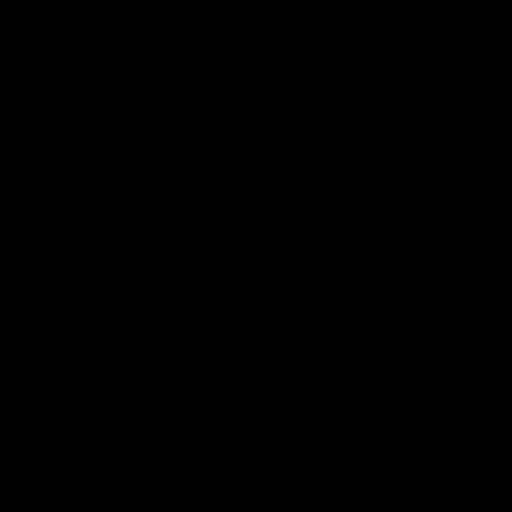

[16 of 30 positions shown; findings below may reference images not displayed]

FINDINGS: There is no evidence for acute infarction, intracranial
hemorrhage, mass lesion, hydrocephalus, or extra-axial fluid.
Large remote left MCA territory infarct affects the left
frontotemporal region.  Advanced atrophy and chronic microvascular
ischemic change. No skull fracture.  Chronic mastoid disease.  No
sinus air-fluid levels.  Similar appearance to priors except for
resolving scalp hematoma.
IMPRESSION: Chronic changes as described.  No acute intracranial findings.

## 2013-09-09 ENCOUNTER — Emergency Department (HOSPITAL_COMMUNITY): Payer: Medicaid Other

## 2013-09-09 ENCOUNTER — Emergency Department (HOSPITAL_COMMUNITY)
Admission: EM | Admit: 2013-09-09 | Discharge: 2013-09-09 | Disposition: A | Discharge status: Home / Self Care | Payer: Medicaid Other | Attending: Emergency Medicine | Admitting: Emergency Medicine

## 2013-09-09 ENCOUNTER — Encounter (HOSPITAL_COMMUNITY): Payer: Self-pay | Admitting: Emergency Medicine

## 2013-09-09 DIAGNOSIS — S0003XA Contusion of scalp, initial encounter: Secondary | ICD-10-CM | POA: Insufficient documentation

## 2013-09-09 DIAGNOSIS — S0180XA Unspecified open wound of other part of head, initial encounter: Secondary | ICD-10-CM | POA: Insufficient documentation

## 2013-09-09 DIAGNOSIS — F0390 Unspecified dementia without behavioral disturbance: Secondary | ICD-10-CM | POA: Insufficient documentation

## 2013-09-09 DIAGNOSIS — G40909 Epilepsy, unspecified, not intractable, without status epilepticus: Secondary | ICD-10-CM | POA: Insufficient documentation

## 2013-09-09 DIAGNOSIS — W19XXXA Unspecified fall, initial encounter: Secondary | ICD-10-CM

## 2013-09-09 DIAGNOSIS — Z7982 Long term (current) use of aspirin: Secondary | ICD-10-CM | POA: Insufficient documentation

## 2013-09-09 DIAGNOSIS — E119 Type 2 diabetes mellitus without complications: Secondary | ICD-10-CM | POA: Insufficient documentation

## 2013-09-09 DIAGNOSIS — Y9389 Activity, other specified: Secondary | ICD-10-CM | POA: Insufficient documentation

## 2013-09-09 DIAGNOSIS — Y929 Unspecified place or not applicable: Secondary | ICD-10-CM | POA: Insufficient documentation

## 2013-09-09 DIAGNOSIS — Z8659 Personal history of other mental and behavioral disorders: Secondary | ICD-10-CM | POA: Insufficient documentation

## 2013-09-09 DIAGNOSIS — W06XXXA Fall from bed, initial encounter: Secondary | ICD-10-CM | POA: Insufficient documentation

## 2013-09-09 DIAGNOSIS — I10 Essential (primary) hypertension: Secondary | ICD-10-CM | POA: Insufficient documentation

## 2013-09-09 DIAGNOSIS — Z87828 Personal history of other (healed) physical injury and trauma: Secondary | ICD-10-CM | POA: Insufficient documentation

## 2013-09-09 DIAGNOSIS — S1093XA Contusion of unspecified part of neck, initial encounter: Secondary | ICD-10-CM

## 2013-09-09 DIAGNOSIS — Z79899 Other long term (current) drug therapy: Secondary | ICD-10-CM | POA: Insufficient documentation

## 2013-09-09 DIAGNOSIS — S0181XA Laceration without foreign body of other part of head, initial encounter: Secondary | ICD-10-CM

## 2013-09-09 DIAGNOSIS — K219 Gastro-esophageal reflux disease without esophagitis: Secondary | ICD-10-CM | POA: Insufficient documentation

## 2013-09-09 DIAGNOSIS — S0083XA Contusion of other part of head, initial encounter: Secondary | ICD-10-CM

## 2013-09-09 HISTORY — DX: Hypokalemia: E87.6

## 2013-09-09 LAB — CBG MONITORING, ED: GLUCOSE-CAPILLARY: 89 mg/dL (ref 70–99)

## 2013-09-09 LAB — BASIC METABOLIC PANEL
BUN: 21 mg/dL (ref 6–23)
CO2: 27 mEq/L (ref 19–32)
Calcium: 8.4 mg/dL (ref 8.4–10.5)
Chloride: 99 mEq/L (ref 96–112)
Creatinine, Ser: 1.04 mg/dL (ref 0.50–1.35)
GFR, EST AFRICAN AMERICAN: 88 mL/min — AB (ref >90)
GFR, EST NON AFRICAN AMERICAN: 76 mL/min — AB (ref >90)
GLUCOSE: 104 mg/dL — AB (ref 70–99)
POTASSIUM: 3.8 meq/L (ref 3.7–5.3)
SODIUM: 140 meq/L (ref 137–147)

## 2013-09-09 LAB — PHENYTOIN LEVEL, TOTAL: PHENYTOIN LVL: 17.6 ug/mL (ref 10.0–20.0)

## 2013-09-09 MED ORDER — LIDOCAINE HCL (PF) 1 % IJ SOLN
INTRAMUSCULAR | Status: AC
  Filled 2013-09-09: qty 5

## 2013-09-09 MED ORDER — ACETAMINOPHEN 500 MG PO TABS
1000.0000 mg | ORAL_TABLET | Freq: Once | ORAL | Status: AC
  Administered 2013-09-09: 1000 mg via ORAL
  Filled 2013-09-09: qty 2
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
## 2013-09-09 NOTE — ED Notes (Signed)
Patient transported back to [facility] via [facility].  Patient became combative when moved to EMS stretcher.

## 2013-09-09 NOTE — ED Notes (Signed)
Patient from [facility], EMS states patient fell out of bed onto the floor. Patient has laceration and bleeding to right eyebrow.

## 2013-09-09 NOTE — Discharge Instructions (Signed)
If you were given medicines take as directed.  If you are on coumadin or contraceptives realize their levels and effectiveness is altered by many different medicines.  If you have any reaction (rash, tongues swelling, other) to the medicines stop taking and see a physician.   °Please follow up as directed and return to the ER or see a physician for new or worsening symptoms.  Thank you. ° ° °

## 2013-09-09 NOTE — ED Notes (Signed)
Patient lying on right side with eyes closed.  Patient awaiting EMS transport back to [facility].

## 2014-05-26 ENCOUNTER — Telehealth: Payer: Self-pay | Admitting: Orthopedic Surgery

## 2014-05-26 NOTE — Telephone Encounter (Signed)
Call received 05/25/14 from [facility] [place] [facility], relates patient(resident) has fracture ofRight foot, per Patsy; states they'd contacted Dr Hilda LiasKeeling, and had been advised by his office that he was out for the week.  I spoke with Dr Romeo AppleHarrison regarding work-in appointment; he needs to review film, report.  I then pulled up the patient, and found that patient had not come through the Emergency Room; therefore, no images or reports.  I made several attempts to reach the facility, both the appointment scheduler and the nurse's station, and left voice mails.  No response --\ Therefore, followed up today, 05/26/14 - reached [facility], who confirmed that patient had been Xrayed through their mobile unit; she is faxing report and any pertinent notes she can locate. Report only has been received - in Dr Mort SawyersHarrison's box for review.  [facility]'s ph# is [phone]

## 2014-05-27 NOTE — Telephone Encounter (Signed)
We can see when we have an x-rays

## 2014-05-28 NOTE — Telephone Encounter (Signed)
Due to our site having no phone service since yesterday, 05/27/14, we have been unable to confirm whether film has been received, but have scheduled appointment for this patient for next available day of clinic, Monday, 06/01/14,2:30pm  - Forms and appointment letter being delivered personally to [facility] today to notify them -- as phone service still out.

## 2014-06-01 ENCOUNTER — Encounter: Payer: Self-pay | Admitting: Orthopedic Surgery

## 2014-06-01 ENCOUNTER — Ambulatory Visit (INDEPENDENT_AMBULATORY_CARE_PROVIDER_SITE_OTHER): Payer: Medicaid Other | Admitting: Orthopedic Surgery

## 2014-06-01 VITALS — BP 75/68 | Ht 72.0 in | Wt 165.0 lb

## 2014-06-01 DIAGNOSIS — S92309A Fracture of unspecified metatarsal bone(s), unspecified foot, initial encounter for closed fracture: Secondary | ICD-10-CM | POA: Insufficient documentation

## 2014-06-01 DIAGNOSIS — S92414A Nondisplaced fracture of proximal phalanx of right great toe, initial encounter for closed fracture: Secondary | ICD-10-CM

## 2014-06-01 DIAGNOSIS — S92301A Fracture of unspecified metatarsal bone(s), right foot, initial encounter for closed fracture: Secondary | ICD-10-CM
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
## 2014-06-29 ENCOUNTER — Encounter: Payer: Self-pay | Admitting: Orthopedic Surgery

## 2014-06-29 ENCOUNTER — Ambulatory Visit (INDEPENDENT_AMBULATORY_CARE_PROVIDER_SITE_OTHER): Payer: Medicaid Other

## 2014-06-29 ENCOUNTER — Ambulatory Visit (INDEPENDENT_AMBULATORY_CARE_PROVIDER_SITE_OTHER): Payer: Self-pay | Admitting: Orthopedic Surgery

## 2014-06-29 VITALS — BP 104/72 | Ht 72.0 in | Wt 165.0 lb

## 2014-06-29 DIAGNOSIS — S92911D Unspecified fracture of right toe(s), subsequent encounter for fracture with routine healing: Secondary | ICD-10-CM

## 2014-06-29 DIAGNOSIS — S92301A Fracture of unspecified metatarsal bone(s), right foot, initial encounter for closed fracture: Secondary | ICD-10-CM

## 2014-06-29 NOTE — Progress Notes (Signed)
Patient ID: Chris KohlRonnie W Davidson, male   DOB: January 12, 1951, 63 y.o.   MRN: [id number] Chief Complaint  Patient presents with  . Follow-up    4 week recheck and xray great toe+4th metatarsal fx, DOI 05/23/14    Encounter Diagnoses  Name Primary?  Marland Kitchen. Toe fracture, right, with routine healing, subsequent encounter Yes  . Metatarsal fracture, right, closed, initial encounter     BP 104/72 mmHg  Ht 6' (1.829 m)  Wt 165 lb (74.844 kg)  BMI 22.37 kg/m2  The patient had a fracture of his great toe and some metatarsal fractures we re-x-rayed him today and he has a lot of osteopenia but his fractures appear to have healed with minimal angulation or displacement is discharged.

## 2015-04-28 ENCOUNTER — Other Ambulatory Visit (HOSPITAL_COMMUNITY): Payer: Self-pay | Admitting: Internal Medicine

## 2015-04-28 DIAGNOSIS — R1310 Dysphagia, unspecified: Secondary | ICD-10-CM

## 2015-05-20 ENCOUNTER — Ambulatory Visit (HOSPITAL_COMMUNITY): Payer: Medicaid Other | Admitting: Speech Pathology

## 2015-05-20 ENCOUNTER — Other Ambulatory Visit (HOSPITAL_COMMUNITY): Payer: Medicaid Other

## 2015-06-03 ENCOUNTER — Ambulatory Visit (HOSPITAL_COMMUNITY)
Admission: RE | Admit: 2015-06-03 | Discharge: 2015-06-03 | Disposition: A | Discharge status: Home / Self Care | Payer: Medicaid Other | Source: Ambulatory Visit | Attending: Internal Medicine | Admitting: Internal Medicine

## 2015-06-03 ENCOUNTER — Ambulatory Visit (HOSPITAL_COMMUNITY): Payer: Medicaid Other | Attending: Internal Medicine | Admitting: Speech Pathology

## 2015-06-03 DIAGNOSIS — K219 Gastro-esophageal reflux disease without esophagitis: Secondary | ICD-10-CM | POA: Insufficient documentation

## 2015-06-03 DIAGNOSIS — R1310 Dysphagia, unspecified: Secondary | ICD-10-CM

## 2015-06-03 DIAGNOSIS — R1311 Dysphagia, oral phase: Secondary | ICD-10-CM | POA: Insufficient documentation

## 2015-06-03 DIAGNOSIS — I10 Essential (primary) hypertension: Secondary | ICD-10-CM | POA: Diagnosis not present

## 2015-06-03 DIAGNOSIS — F0390 Unspecified dementia without behavioral disturbance: Secondary | ICD-10-CM | POA: Diagnosis not present

## 2015-06-03 DIAGNOSIS — R1313 Dysphagia, pharyngeal phase: Secondary | ICD-10-CM | POA: Diagnosis not present

## 2015-06-03 DIAGNOSIS — E785 Hyperlipidemia, unspecified: Secondary | ICD-10-CM | POA: Diagnosis not present

## 2015-06-03 DIAGNOSIS — E119 Type 2 diabetes mellitus without complications: Secondary | ICD-10-CM | POA: Insufficient documentation
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
## 2015-07-05 ENCOUNTER — Emergency Department (HOSPITAL_COMMUNITY): Payer: Medicaid Other

## 2015-07-05 ENCOUNTER — Emergency Department (HOSPITAL_COMMUNITY)
Admission: EM | Admit: 2015-07-05 | Discharge: 2015-07-05 | Discharge status: Against Medical Advice | Payer: Medicaid Other | Attending: Emergency Medicine | Admitting: Emergency Medicine

## 2015-07-05 ENCOUNTER — Encounter (HOSPITAL_COMMUNITY): Payer: Self-pay | Admitting: Emergency Medicine

## 2015-07-05 DIAGNOSIS — W050XXA Fall from non-moving wheelchair, initial encounter: Secondary | ICD-10-CM | POA: Insufficient documentation

## 2015-07-05 DIAGNOSIS — Z043 Encounter for examination and observation following other accident: Secondary | ICD-10-CM | POA: Insufficient documentation

## 2015-07-05 DIAGNOSIS — F99 Mental disorder, not otherwise specified: Secondary | ICD-10-CM | POA: Diagnosis not present

## 2015-07-05 DIAGNOSIS — Y92001 Dining room of unspecified non-institutional (private) residence as the place of occurrence of the external cause: Secondary | ICD-10-CM | POA: Insufficient documentation

## 2015-07-05 DIAGNOSIS — Y998 Other external cause status: Secondary | ICD-10-CM | POA: Diagnosis not present

## 2015-07-05 DIAGNOSIS — Y9389 Activity, other specified: Secondary | ICD-10-CM | POA: Diagnosis not present

## 2015-07-05 HISTORY — DX: Unspecified convulsions: R56.9

## 2015-07-05 NOTE — ED Notes (Signed)
PT fell backwards out of his wheelchair onto the dining room floor today after lunch. PT denies any pain at this time and sister sates [facility] (SNF) is getting him a replacement wheelchair.

## 2015-10-13 ENCOUNTER — Encounter (HOSPITAL_COMMUNITY): Payer: Self-pay

## 2015-12-27 ENCOUNTER — Emergency Department (HOSPITAL_COMMUNITY): Payer: Medicaid Other

## 2015-12-27 ENCOUNTER — Emergency Department (HOSPITAL_COMMUNITY)
Admission: EM | Admit: 2015-12-27 | Discharge: 2015-12-27 | Disposition: A | Discharge status: Home / Self Care | Payer: Medicaid Other | Attending: Emergency Medicine | Admitting: Emergency Medicine

## 2015-12-27 ENCOUNTER — Encounter (HOSPITAL_COMMUNITY): Payer: Self-pay | Admitting: *Deleted

## 2015-12-27 DIAGNOSIS — Y939 Activity, unspecified: Secondary | ICD-10-CM | POA: Diagnosis not present

## 2015-12-27 DIAGNOSIS — Y999 Unspecified external cause status: Secondary | ICD-10-CM | POA: Diagnosis not present

## 2015-12-27 DIAGNOSIS — S0990XA Unspecified injury of head, initial encounter: Secondary | ICD-10-CM | POA: Insufficient documentation

## 2015-12-27 DIAGNOSIS — S01111A Laceration without foreign body of right eyelid and periocular area, initial encounter: Secondary | ICD-10-CM | POA: Diagnosis not present

## 2015-12-27 DIAGNOSIS — Z7984 Long term (current) use of oral hypoglycemic drugs: Secondary | ICD-10-CM | POA: Diagnosis not present

## 2015-12-27 DIAGNOSIS — S8001XA Contusion of right knee, initial encounter: Secondary | ICD-10-CM | POA: Diagnosis not present

## 2015-12-27 DIAGNOSIS — E785 Hyperlipidemia, unspecified: Secondary | ICD-10-CM | POA: Diagnosis not present

## 2015-12-27 DIAGNOSIS — Y929 Unspecified place or not applicable: Secondary | ICD-10-CM | POA: Insufficient documentation

## 2015-12-27 DIAGNOSIS — W06XXXA Fall from bed, initial encounter: Secondary | ICD-10-CM | POA: Diagnosis not present

## 2015-12-27 DIAGNOSIS — I10 Essential (primary) hypertension: Secondary | ICD-10-CM | POA: Diagnosis not present

## 2015-12-27 DIAGNOSIS — E119 Type 2 diabetes mellitus without complications: Secondary | ICD-10-CM | POA: Insufficient documentation

## 2015-12-27 DIAGNOSIS — S0181XA Laceration without foreign body of other part of head, initial encounter: Secondary | ICD-10-CM

## 2015-12-27 DIAGNOSIS — Z79899 Other long term (current) drug therapy: Secondary | ICD-10-CM | POA: Diagnosis not present

## 2015-12-27 DIAGNOSIS — Z7982 Long term (current) use of aspirin: Secondary | ICD-10-CM | POA: Insufficient documentation

## 2015-12-27 MED ORDER — LIDOCAINE-EPINEPHRINE (PF) 1 %-1:200000 IJ SOLN
10.0000 mL | Freq: Once | INTRAMUSCULAR | Status: AC
  Administered 2015-12-27: 10 mL via INTRADERMAL
  Filled 2015-12-27: qty 10

## 2015-12-27 MED ORDER — BACITRACIN ZINC 500 UNIT/GM EX OINT
TOPICAL_OINTMENT | Freq: Once | CUTANEOUS | Status: AC
  Administered 2015-12-27: 04:00:00 via TOPICAL
  Filled 2015-12-27: qty 1.8

## 2015-12-27 NOTE — ED Notes (Signed)
Last tetanus was noted in previous chart on 05/2012

## 2015-12-27 NOTE — ED Notes (Signed)
Pt is a resident of [facility] who was sent to er after having an unwitnessed fall from his bed, pt has laceration noted to right eyebrow and abrasion to right knee, staff also concerned because pt has been running a low grade fever,

## 2015-12-27 NOTE — Discharge Instructions (Signed)
Your laceration was repaired using absorbable sutures. You will not need to have these removed. The wall, on their own. You may wash this area gently with warm soap and water. You may apply Neosporin or bacitracin twice a day. You may take Tylenol as needed for pain.   Facial Laceration  A facial laceration is a cut on the face. These injuries can be painful and cause bleeding. Lacerations usually heal quickly, but they need special care to reduce scarring. DIAGNOSIS  Your health care provider will take a medical history, ask for details about how the injury occurred, and examine the wound to determine how deep the cut is. TREATMENT  Some facial lacerations may not require closure. Others may not be able to be closed because of an increased risk of infection. The risk of infection and the chance for successful closure will depend on various factors, including the amount of time since the injury occurred. The wound may be cleaned to help prevent infection. If closure is appropriate, pain medicines may be given if needed. Your health care provider will use stitches (sutures), wound glue (adhesive), or skin adhesive strips to repair the laceration. These tools bring the skin edges together to allow for faster healing and a better cosmetic outcome. If needed, you may also be given a tetanus shot. HOME CARE INSTRUCTIONS  Only take over-the-counter or prescription medicines as directed by your health care provider.  Follow your health care provider's instructions for wound care. These instructions will vary depending on the technique used for closing the wound. For Sutures:  Keep the wound clean and dry.   If you were given a bandage (dressing), you should change it at least once a day. Also change the dressing if it becomes wet or dirty, or as directed by your health care provider.   Wash the wound with soap and water 2 times a day. Rinse the wound off with water to remove all soap. Pat the wound  dry with a clean towel.   After cleaning, apply a thin layer of the antibiotic ointment recommended by your health care provider. This will help prevent infection and keep the dressing from sticking.   You may shower as usual after the first 24 hours. Do not soak the wound in water until the sutures are removed.   Get your sutures removed as directed by your health care provider. With facial lacerations, sutures should usually be taken out after 4-5 days to avoid stitch marks.   Wait a few days after your sutures are removed before applying any makeup. For Skin Adhesive Strips:  Keep the wound clean and dry.   Do not get the skin adhesive strips wet. You may bathe carefully, using caution to keep the wound dry.   If the wound gets wet, pat it dry with a clean towel.   Skin adhesive strips will fall off on their own. You may trim the strips as the wound heals. Do not remove skin adhesive strips that are still stuck to the wound. They will fall off in time.  For Wound Adhesive:  You may briefly wet your wound in the shower or bath. Do not soak or scrub the wound. Do not swim. Avoid periods of heavy sweating until the skin adhesive has fallen off on its own. After showering or bathing, gently pat the wound dry with a clean towel.   Do not apply liquid medicine, cream medicine, ointment medicine, or makeup to your wound while the skin adhesive is  in place. This may loosen the film before your wound is healed.   If a dressing is placed over the wound, be careful not to apply tape directly over the skin adhesive. This may cause the adhesive to be pulled off before the wound is healed.   Avoid prolonged exposure to sunlight or tanning lamps while the skin adhesive is in place.  The skin adhesive will usually remain in place for 5-10 days, then naturally fall off the skin. Do not pick at the adhesive film.  After Healing: Once the wound has healed, cover the wound with sunscreen  during the day for 1 full year. This can help minimize scarring. Exposure to ultraviolet light in the first year will darken the scar. It can take 1-2 years for the scar to lose its redness and to heal completely.  SEEK MEDICAL CARE IF:  You have a fever. SEEK IMMEDIATE MEDICAL CARE IF:  You have redness, pain, or swelling around the wound.   You see ayellowish-white fluid (pus) coming from the wound.    This information is not intended to replace advice given to you by your health care provider. Make sure you discuss any questions you have with your health care provider.   Document Released: 08/10/2004 Document Revised: 07/24/2014 Document Reviewed: 02/13/2013 Elsevier Interactive Patient Education 2016 [employer]  Head Injury, Adult You have a head injury. Headaches and throwing up (vomiting) are common after a head injury. It should be easy to wake up from sleeping. Sometimes you must stay in the hospital. Most problems happen within the first 24 hours. Side effects may occur up to 7-10 days after the injury.  WHAT ARE THE TYPES OF HEAD INJURIES? Head injuries can be as minor as a bump. Some head injuries can be more severe. More severe head injuries include:  A jarring injury to the brain (concussion).  A bruise of the brain (contusion). This mean there is bleeding in the brain that can cause swelling.  A cracked skull (skull fracture).  Bleeding in the brain that collects, clots, and forms a bump (hematoma). WHEN SHOULD I GET HELP RIGHT AWAY?   You are confused or sleepy.  You cannot be woken up.  You feel sick to your stomach (nauseous) or keep throwing up (vomiting).  Your dizziness or unsteadiness is getting worse.  You have very bad, lasting headaches that are not helped by medicine. Take medicines only as told by your doctor.  You cannot use your arms or legs like normal.  You cannot walk.  You notice changes in the black spots in the center of the colored  part of your eye (pupil).  You have clear or bloody fluid coming from your nose or ears.  You have trouble seeing. During the next 24 hours after the injury, you must stay with someone who can watch you. This person should get help right away (call 911 in the [place] if you start to shake and are not able to control it (have seizures), you pass out, or you are unable to wake up. HOW CAN I PREVENT A HEAD INJURY IN THE FUTURE?  Wear seat belts.  Wear a helmet while bike riding and playing sports like football.  Stay away from dangerous activities around the house. WHEN CAN I RETURN TO NORMAL ACTIVITIES AND ATHLETICS? See your doctor before doing these activities. You should not do normal activities or play contact sports until 1 week after the following symptoms have stopped:  Headache that does  not go away.  Dizziness.  Poor attention.  Confusion.  Memory problems.  Sickness to your stomach or throwing up.  Tiredness.  Fussiness.  Bothered by bright lights or loud noises.  Anxiousness or depression.  Restless sleep. MAKE SURE YOU:   Understand these instructions.  Will watch your condition.  Will get help right away if you are not doing well or get worse.   This information is not intended to replace advice given to you by your health care provider. Make sure you discuss any questions you have with your health care provider.   Document Released: 06/15/2008 Document Revised: 07/24/2014 Document Reviewed: 03/10/2013 Elsevier Interactive Patient Education 2016 [employer]  Contusion A contusion is a deep bruise. Contusions are the result of a blunt injury to tissues and muscle fibers under the skin. The injury causes bleeding under the skin. The skin overlying the contusion may turn blue, purple, or yellow. Minor injuries will give you a painless contusion, but more severe contusions may stay painful and swollen for a few weeks.  CAUSES  This condition is usually  caused by a blow, trauma, or direct force to an area of the body. SYMPTOMS  Symptoms of this condition include:  Swelling of the injured area.  Pain and tenderness in the injured area.  Discoloration. The area may have redness and then turn blue, purple, or yellow. DIAGNOSIS  This condition is diagnosed based on a physical exam and medical history. An X-ray, CT scan, or MRI may be needed to determine if there are any associated injuries, such as broken bones (fractures). TREATMENT  Specific treatment for this condition depends on what area of the body was injured. In general, the best treatment for a contusion is resting, icing, applying pressure to (compression), and elevating the injured area. This is often called the RICE strategy. Over-the-counter anti-inflammatory medicines may also be recommended for pain control.  HOME CARE INSTRUCTIONS   Rest the injured area.  If directed, apply ice to the injured area:  Put ice in a plastic bag.  Place a towel between your skin and the bag.  Leave the ice on for 20 minutes, 2-3 times per day.  If directed, apply light compression to the injured area using an elastic bandage. Make sure the bandage is not wrapped too tightly. Remove and reapply the bandage as directed by your health care provider.  If possible, raise (elevate) the injured area above the level of your heart while you are sitting or lying down.  Take over-the-counter and prescription medicines only as told by your health care provider. SEEK MEDICAL CARE IF:  Your symptoms do not improve after several days of treatment.  Your symptoms get worse.  You have difficulty moving the injured area. SEEK IMMEDIATE MEDICAL CARE IF:   You have severe pain.  You have numbness in a hand or foot.  Your hand or foot turns pale or cold.   This information is not intended to replace advice given to you by your health care provider. Make sure you discuss any questions you have with  your health care provider.   Document Released: 04/12/2005 Document Revised: 03/24/2015 Document Reviewed: 11/18/2014 Elsevier Interactive Patient Education 2016 [employer] Inc[employer]   RICE for Routine Care of Injuries Theroutine careofmanyinjuriesincludes rest, ice, compression, and elevation (RICE therapy). RICE therapy is often recommended for injuries to soft tissues, such as a muscle strain, ligament injuries, bruises, and overuse injuries. It can also be used for some bony injuries. Using RICE  therapy can help to relieve pain, lessen swelling, and enable your body to heal. Rest Rest is required to allow your body to heal. This usually involves reducing your normal activities and avoiding use of the injured part of your body. Generally, you can return to your normal activities when you are comfortable and have been given permission by your health care provider. Ice Icing your injury helps to keep the swelling down, and it lessens pain. Do not apply ice directly to your skin.  Put ice in a plastic bag.  Place a towel between your skin and the bag.  Leave the ice on for 20 minutes, 2-3 times a day. Do this for as long as you are directed by your health care provider. Compression Compression means putting pressure on the injured area. Compression helps to keep swelling down, gives support, and helps with discomfort. Compression may be done with an elastic bandage. If an elastic bandage has been applied, follow these general tips:  Remove and reapply the bandage every 3-4 hours or as directed by your health care provider.  Make sure the bandage is not wrapped too tightly, because this can cut off circulation. If part of your body beyond the bandage becomes blue, numb, cold, swollen, or more painful, your bandage is most likely too tight. If this occurs, remove your bandage and reapply it more loosely.  See your health care provider if the bandage seems to be making your problems worse  rather than better. Elevation Elevation means keeping the injured area raised. This helps to lessen swelling and decrease pain. If possible, your injured area should be elevated at or above the level of your heart or the center of your chest. WHEN SHOULD I SEEK MEDICAL CARE? You should seek medical care if:  Your pain and swelling continue.  Your symptoms are getting worse rather than improving. These symptoms may indicate that further evaluation or further X-rays are needed. Sometimes, X-rays may not show a small broken bone (fracture) until a number of days later. Make a follow-up appointment with your health care provider. WHEN SHOULD I SEEK IMMEDIATE MEDICAL CARE? You should seek immediate medical care if:  You have sudden severe pain at or below the area of your injury.  You have redness or increased swelling around your injury.  You have tingling or numbness at or below the area of your injury that does not improve after you remove the elastic bandage.   This information is not intended to replace advice given to you by your health care provider. Make sure you discuss any questions you have with your health care provider.   Document Released: 10/15/2000 Document Revised: 03/24/2015 Document Reviewed: 06/10/2014 Elsevier Interactive Patient Education [employer]

## 2015-12-27 NOTE — ED Notes (Signed)
Attempted to call report to [facility] but got no answer. When EMS arrived to pick up pt; told them that they could have [facility] staff call for report.
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
## 2019-06-15 ENCOUNTER — Emergency Department (HOSPITAL_COMMUNITY): Payer: Medicare Other

## 2019-06-15 ENCOUNTER — Emergency Department (HOSPITAL_COMMUNITY)
Admission: EM | Admit: 2019-06-15 | Discharge: 2019-06-15 | Disposition: A | Discharge status: Home / Self Care | Payer: Medicare Other | Attending: Emergency Medicine | Admitting: Emergency Medicine

## 2019-06-15 ENCOUNTER — Other Ambulatory Visit: Payer: Self-pay

## 2019-06-15 ENCOUNTER — Encounter (HOSPITAL_COMMUNITY): Payer: Self-pay

## 2019-06-15 DIAGNOSIS — E119 Type 2 diabetes mellitus without complications: Secondary | ICD-10-CM | POA: Insufficient documentation

## 2019-06-15 DIAGNOSIS — F0390 Unspecified dementia without behavioral disturbance: Secondary | ICD-10-CM | POA: Diagnosis not present

## 2019-06-15 DIAGNOSIS — S0990XA Unspecified injury of head, initial encounter: Secondary | ICD-10-CM | POA: Insufficient documentation

## 2019-06-15 DIAGNOSIS — Y939 Activity, unspecified: Secondary | ICD-10-CM | POA: Diagnosis not present

## 2019-06-15 DIAGNOSIS — Z7982 Long term (current) use of aspirin: Secondary | ICD-10-CM | POA: Insufficient documentation

## 2019-06-15 DIAGNOSIS — I10 Essential (primary) hypertension: Secondary | ICD-10-CM | POA: Diagnosis not present

## 2019-06-15 DIAGNOSIS — Z7984 Long term (current) use of oral hypoglycemic drugs: Secondary | ICD-10-CM | POA: Insufficient documentation

## 2019-06-15 DIAGNOSIS — W06XXXA Fall from bed, initial encounter: Secondary | ICD-10-CM | POA: Diagnosis not present

## 2019-06-15 DIAGNOSIS — Y92003 Bedroom of unspecified non-institutional (private) residence as the place of occurrence of the external cause: Secondary | ICD-10-CM | POA: Diagnosis not present

## 2019-06-15 DIAGNOSIS — R519 Headache, unspecified: Secondary | ICD-10-CM | POA: Insufficient documentation

## 2019-06-15 DIAGNOSIS — Z79899 Other long term (current) drug therapy: Secondary | ICD-10-CM | POA: Insufficient documentation

## 2019-06-15 DIAGNOSIS — K5900 Constipation, unspecified: Secondary | ICD-10-CM

## 2019-06-15 DIAGNOSIS — W19XXXA Unspecified fall, initial encounter: Secondary | ICD-10-CM

## 2019-06-15 DIAGNOSIS — Y999 Unspecified external cause status: Secondary | ICD-10-CM | POA: Diagnosis not present

## 2019-06-15 HISTORY — DX: Repeated falls: R29.6

## 2019-06-15 HISTORY — DX: Major depressive disorder, single episode, unspecified: F32.9

## 2019-06-15 HISTORY — DX: Anxiety disorder, unspecified: F41.9

## 2019-06-15 MED ORDER — ACETAMINOPHEN 500 MG PO TABS
1000.0000 mg | ORAL_TABLET | Freq: Once | ORAL | Status: AC
  Administered 2019-06-15: 1000 mg via ORAL
  Filled 2019-06-15: qty 2

## 2019-06-15 MED ORDER — POLYETHYLENE GLYCOL 3350 17 G PO PACK
17.0000 g | PACK | Freq: Once | ORAL | Status: AC
  Administered 2019-06-15: 17 g via ORAL
  Filled 2019-06-15: qty 1

## 2019-06-15 NOTE — ED Triage Notes (Signed)
Pt fell Friday at [facility]. Pt reports that he fell out bed. Pt complained of pain in the back of his head . Denied pain at this time. Pt oriented to person and place which is baseline

## 2019-06-15 NOTE — ED Notes (Signed)
EMS called to transport patient back to facility.  
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
## 2019-08-01 ENCOUNTER — Other Ambulatory Visit: Payer: Self-pay

## 2019-08-01 ENCOUNTER — Encounter (HOSPITAL_COMMUNITY): Payer: Self-pay | Admitting: Emergency Medicine

## 2019-08-01 ENCOUNTER — Inpatient Hospital Stay (HOSPITAL_COMMUNITY)
Admission: EM | Admit: 2019-08-01 | Discharge: 2019-08-06 | DRG: 193 | Disposition: A | Discharge status: Skilled Nursing Facility | Payer: Medicare Other | Attending: Internal Medicine | Admitting: Internal Medicine

## 2019-08-01 ENCOUNTER — Emergency Department (HOSPITAL_COMMUNITY): Payer: Medicare Other

## 2019-08-01 DIAGNOSIS — S069X9A Unspecified intracranial injury with loss of consciousness of unspecified duration, initial encounter: Secondary | ICD-10-CM | POA: Diagnosis present

## 2019-08-01 DIAGNOSIS — Z9181 History of falling: Secondary | ICD-10-CM | POA: Diagnosis not present

## 2019-08-01 DIAGNOSIS — F419 Anxiety disorder, unspecified: Secondary | ICD-10-CM | POA: Diagnosis present

## 2019-08-01 DIAGNOSIS — F0390 Unspecified dementia without behavioral disturbance: Secondary | ICD-10-CM | POA: Diagnosis present

## 2019-08-01 DIAGNOSIS — Z7984 Long term (current) use of oral hypoglycemic drugs: Secondary | ICD-10-CM

## 2019-08-01 DIAGNOSIS — Z7982 Long term (current) use of aspirin: Secondary | ICD-10-CM

## 2019-08-01 DIAGNOSIS — K5641 Fecal impaction: Secondary | ICD-10-CM | POA: Diagnosis present

## 2019-08-01 DIAGNOSIS — Z79899 Other long term (current) drug therapy: Secondary | ICD-10-CM | POA: Diagnosis not present

## 2019-08-01 DIAGNOSIS — G9341 Metabolic encephalopathy: Secondary | ICD-10-CM

## 2019-08-01 DIAGNOSIS — S069X9D Unspecified intracranial injury with loss of consciousness of unspecified duration, subsequent encounter: Secondary | ICD-10-CM | POA: Diagnosis not present

## 2019-08-01 DIAGNOSIS — Z20822 Contact with and (suspected) exposure to covid-19: Secondary | ICD-10-CM | POA: Diagnosis present

## 2019-08-01 DIAGNOSIS — G309 Alzheimer's disease, unspecified: Secondary | ICD-10-CM | POA: Diagnosis not present

## 2019-08-01 DIAGNOSIS — I10 Essential (primary) hypertension: Secondary | ICD-10-CM | POA: Diagnosis present

## 2019-08-01 DIAGNOSIS — G40909 Epilepsy, unspecified, not intractable, without status epilepticus: Secondary | ICD-10-CM | POA: Diagnosis present

## 2019-08-01 DIAGNOSIS — E119 Type 2 diabetes mellitus without complications: Secondary | ICD-10-CM | POA: Diagnosis present

## 2019-08-01 DIAGNOSIS — R569 Unspecified convulsions: Secondary | ICD-10-CM | POA: Diagnosis not present

## 2019-08-01 DIAGNOSIS — K219 Gastro-esophageal reflux disease without esophagitis: Secondary | ICD-10-CM | POA: Diagnosis present

## 2019-08-01 DIAGNOSIS — F0280 Dementia in other diseases classified elsewhere without behavioral disturbance: Secondary | ICD-10-CM | POA: Diagnosis not present

## 2019-08-01 DIAGNOSIS — R1312 Dysphagia, oropharyngeal phase: Secondary | ICD-10-CM | POA: Diagnosis present

## 2019-08-01 DIAGNOSIS — E785 Hyperlipidemia, unspecified: Secondary | ICD-10-CM | POA: Diagnosis present

## 2019-08-01 DIAGNOSIS — E871 Hypo-osmolality and hyponatremia: Secondary | ICD-10-CM | POA: Diagnosis not present

## 2019-08-01 DIAGNOSIS — J159 Unspecified bacterial pneumonia: Principal | ICD-10-CM | POA: Diagnosis present

## 2019-08-01 DIAGNOSIS — N179 Acute kidney failure, unspecified: Secondary | ICD-10-CM | POA: Diagnosis present

## 2019-08-01 DIAGNOSIS — S069XAA Unspecified intracranial injury with loss of consciousness status unknown, initial encounter: Secondary | ICD-10-CM | POA: Diagnosis present

## 2019-08-01 DIAGNOSIS — Z87820 Personal history of traumatic brain injury: Secondary | ICD-10-CM | POA: Diagnosis not present

## 2019-08-01 DIAGNOSIS — R4182 Altered mental status, unspecified: Secondary | ICD-10-CM

## 2019-08-01 DIAGNOSIS — E860 Dehydration: Secondary | ICD-10-CM | POA: Diagnosis present

## 2019-08-01 DIAGNOSIS — J189 Pneumonia, unspecified organism: Secondary | ICD-10-CM | POA: Diagnosis present

## 2019-08-01 DIAGNOSIS — Z66 Do not resuscitate: Secondary | ICD-10-CM | POA: Diagnosis present

## 2019-08-01 DIAGNOSIS — E870 Hyperosmolality and hypernatremia: Secondary | ICD-10-CM | POA: Diagnosis present

## 2019-08-01 LAB — CBC WITH DIFFERENTIAL/PLATELET
Abs Immature Granulocytes: 0.01 10*3/uL (ref 0.00–0.07)
Basophils Absolute: 0 10*3/uL (ref 0.0–0.1)
Basophils Relative: 0 %
Eosinophils Absolute: 0 10*3/uL (ref 0.0–0.5)
Eosinophils Relative: 0 %
HCT: 42.7 % (ref 39.0–52.0)
Hemoglobin: 13.2 g/dL (ref 13.0–17.0)
Immature Granulocytes: 0 %
Lymphocytes Relative: 36 %
Lymphs Abs: 1.6 10*3/uL (ref 0.7–4.0)
MCH: 30 pg (ref 26.0–34.0)
MCHC: 30.9 g/dL (ref 30.0–36.0)
MCV: 97 fL (ref 80.0–100.0)
Monocytes Absolute: 0.6 10*3/uL (ref 0.1–1.0)
Monocytes Relative: 13 %
Neutro Abs: 2.2 10*3/uL (ref 1.7–7.7)
Neutrophils Relative %: 51 %
Platelets: 99 10*3/uL — ABNORMAL LOW (ref 150–400)
RBC: 4.4 MIL/uL (ref 4.22–5.81)
RDW: 13.3 % (ref 11.5–15.5)
WBC: 4.4 10*3/uL (ref 4.0–10.5)
nRBC: 0 % (ref 0.0–0.2)

## 2019-08-01 LAB — BASIC METABOLIC PANEL
Anion gap: 9 (ref 5–15)
BUN: 42 mg/dL — ABNORMAL HIGH (ref 8–23)
CO2: 29 mmol/L (ref 22–32)
Calcium: 7.8 mg/dL — ABNORMAL LOW (ref 8.9–10.3)
Chloride: 118 mmol/L — ABNORMAL HIGH (ref 98–111)
Creatinine, Ser: 2.04 mg/dL — ABNORMAL HIGH (ref 0.61–1.24)
GFR calc Af Amer: 38 mL/min — ABNORMAL LOW (ref >60)
GFR calc non Af Amer: 33 mL/min — ABNORMAL LOW (ref >60)
Glucose, Bld: 231 mg/dL — ABNORMAL HIGH (ref 70–99)
Potassium: 4 mmol/L (ref 3.5–5.1)
Sodium: 156 mmol/L — ABNORMAL HIGH (ref 135–145)

## 2019-08-01 LAB — LACTATE DEHYDROGENASE: LDH: 214 U/L — ABNORMAL HIGH (ref 98–192)

## 2019-08-01 LAB — FIBRINOGEN: Fibrinogen: 478 mg/dL — ABNORMAL HIGH (ref 210–475)

## 2019-08-01 LAB — VALPROIC ACID LEVEL: Valproic Acid Lvl: 53 ug/mL (ref 50.0–100.0)

## 2019-08-01 LAB — D-DIMER, QUANTITATIVE: D-Dimer, Quant: 1.29 ug/mL-FEU — ABNORMAL HIGH (ref 0.00–0.50)

## 2019-08-01 LAB — PHENYTOIN LEVEL, TOTAL: Phenytoin Lvl: 5.1 ug/mL — ABNORMAL LOW (ref 10.0–20.0)

## 2019-08-01 LAB — GLUCOSE, CAPILLARY: Glucose-Capillary: 124 mg/dL — ABNORMAL HIGH (ref 70–99)

## 2019-08-01 LAB — ABO/RH: ABO/RH(D): A POS

## 2019-08-01 LAB — PROCALCITONIN: Procalcitonin: 0.15 ng/mL

## 2019-08-01 LAB — FERRITIN: Ferritin: 360 ng/mL — ABNORMAL HIGH (ref 24–336)

## 2019-08-01 MED ORDER — ONDANSETRON HCL 4 MG/2ML IJ SOLN: 4.0000 mg | Freq: Four times a day (QID) | INTRAMUSCULAR | Status: DC | PRN

## 2019-08-01 MED ORDER — VITAMIN D 25 MCG (1000 UNIT) PO TABS
2000.0000 [IU] | ORAL_TABLET | Freq: Every day | ORAL | Status: DC
  Administered 2019-08-02 – 2019-08-06 (×5): 2000 [IU] via ORAL
  Filled 2019-08-01 (×5): qty 2

## 2019-08-01 MED ORDER — AMLODIPINE BESYLATE 5 MG PO TABS
10.0000 mg | ORAL_TABLET | Freq: Every day | ORAL | Status: DC
  Administered 2019-08-02 – 2019-08-06 (×5): 10 mg via ORAL
  Filled 2019-08-01 (×5): qty 2

## 2019-08-01 MED ORDER — ADULT MULTIVITAMIN W/MINERALS CH
1.0000 | ORAL_TABLET | Freq: Every day | ORAL | Status: DC
  Administered 2019-08-02 – 2019-08-06 (×5): 1 via ORAL
  Filled 2019-08-01 (×5): qty 1

## 2019-08-01 MED ORDER — SODIUM CHLORIDE 0.9 % IV SOLN: 250.0000 mL | INTRAVENOUS | Status: DC | PRN

## 2019-08-01 MED ORDER — SODIUM CHLORIDE 0.9 % IV BOLUS
1000.0000 mL | Freq: Once | INTRAVENOUS | Status: AC
  Administered 2019-08-01: 1000 mL via INTRAVENOUS

## 2019-08-01 MED ORDER — ALBUTEROL SULFATE HFA 108 (90 BASE) MCG/ACT IN AERS
2.0000 | INHALATION_SPRAY | Freq: Four times a day (QID) | RESPIRATORY_TRACT | Status: DC
  Administered 2019-08-01 – 2019-08-03 (×8): 2 via RESPIRATORY_TRACT
  Filled 2019-08-01: qty 6.7

## 2019-08-01 MED ORDER — SODIUM CHLORIDE 0.9% FLUSH: 3.0000 mL | INTRAVENOUS | Status: DC | PRN

## 2019-08-01 MED ORDER — FAMOTIDINE 20 MG PO TABS
20.0000 mg | ORAL_TABLET | Freq: Every day | ORAL | Status: DC
  Administered 2019-08-02 – 2019-08-06 (×5): 20 mg via ORAL
  Filled 2019-08-01 (×5): qty 1

## 2019-08-01 MED ORDER — METHYLPREDNISOLONE SODIUM SUCC 40 MG IJ SOLR
40.0000 mg | Freq: Two times a day (BID) | INTRAMUSCULAR | Status: DC
  Administered 2019-08-01 – 2019-08-04 (×6): 40 mg via INTRAVENOUS
  Filled 2019-08-01 (×5): qty 1

## 2019-08-01 MED ORDER — MEGESTROL ACETATE 625 MG/5ML PO SUSP
625.0000 mg | Freq: Every day | ORAL | Status: DC
  Filled 2019-08-01: qty 5

## 2019-08-01 MED ORDER — INSULIN ASPART 100 UNIT/ML ~~LOC~~ SOLN
0.0000 [IU] | Freq: Three times a day (TID) | SUBCUTANEOUS | Status: DC
  Administered 2019-08-02 (×2): 2 [IU] via SUBCUTANEOUS
  Administered 2019-08-03 – 2019-08-04 (×2): 5 [IU] via SUBCUTANEOUS
  Administered 2019-08-04 – 2019-08-05 (×3): 2 [IU] via SUBCUTANEOUS
  Administered 2019-08-05: 3 [IU] via SUBCUTANEOUS
  Administered 2019-08-06: 2 [IU] via SUBCUTANEOUS

## 2019-08-01 MED ORDER — SODIUM CHLORIDE 0.9 % IV SOLN
1.0000 g | INTRAVENOUS | Status: DC
  Administered 2019-08-01: 1 g via INTRAVENOUS
  Filled 2019-08-01: qty 10

## 2019-08-01 MED ORDER — SODIUM CHLORIDE 0.9 % IV SOLN
500.0000 mg | INTRAVENOUS | Status: DC
  Administered 2019-08-01 – 2019-08-04 (×4): 500 mg via INTRAVENOUS
  Filled 2019-08-01 (×4): qty 500

## 2019-08-01 MED ORDER — SODIUM CHLORIDE 0.9 % IV SOLN
100.0000 mg | Freq: Every day | INTRAVENOUS | Status: DC
  Filled 2019-08-01 (×3): qty 20

## 2019-08-01 MED ORDER — GUAIFENESIN-DM 100-10 MG/5ML PO SYRP: 10.0000 mL | ORAL_SOLUTION | ORAL | Status: DC | PRN

## 2019-08-01 MED ORDER — TRAZODONE HCL 50 MG PO TABS: 50.0000 mg | ORAL_TABLET | Freq: Every evening | ORAL | Status: DC | PRN

## 2019-08-01 MED ORDER — DIVALPROEX SODIUM 250 MG PO DR TAB
500.0000 mg | DELAYED_RELEASE_TABLET | Freq: Three times a day (TID) | ORAL | Status: DC
  Administered 2019-08-02 – 2019-08-06 (×10): 500 mg via ORAL
  Filled 2019-08-01 (×12): qty 2

## 2019-08-01 MED ORDER — ACETAMINOPHEN 325 MG PO TABS: 650.0000 mg | ORAL_TABLET | ORAL | Status: DC | PRN

## 2019-08-01 MED ORDER — HYDROCOD POLST-CPM POLST ER 10-8 MG/5ML PO SUER: 5.0000 mL | Freq: Two times a day (BID) | ORAL | Status: DC | PRN

## 2019-08-01 MED ORDER — LORAZEPAM 0.5 MG PO TABS
0.5000 mg | ORAL_TABLET | Freq: Three times a day (TID) | ORAL | Status: DC
  Administered 2019-08-02 – 2019-08-06 (×10): 0.5 mg via ORAL
  Filled 2019-08-01 (×12): qty 1

## 2019-08-01 MED ORDER — LEVETIRACETAM 500 MG PO TABS
500.0000 mg | ORAL_TABLET | Freq: Two times a day (BID) | ORAL | Status: DC
  Administered 2019-08-02 – 2019-08-06 (×8): 500 mg via ORAL
  Filled 2019-08-01 (×8): qty 1

## 2019-08-01 MED ORDER — GUAIFENESIN ER 600 MG PO TB12
600.0000 mg | ORAL_TABLET | Freq: Two times a day (BID) | ORAL | Status: DC
  Administered 2019-08-02 – 2019-08-06 (×8): 600 mg via ORAL
  Filled 2019-08-01 (×9): qty 1

## 2019-08-01 MED ORDER — MIRTAZAPINE 30 MG PO TABS
30.0000 mg | ORAL_TABLET | Freq: Every day | ORAL | Status: DC
  Administered 2019-08-03 – 2019-08-05 (×3): 30 mg via ORAL
  Filled 2019-08-01 (×3): qty 1

## 2019-08-01 MED ORDER — INSULIN ASPART 100 UNIT/ML ~~LOC~~ SOLN: 0.0000 [IU] | Freq: Every day | SUBCUTANEOUS | Status: DC

## 2019-08-01 MED ORDER — PHENYTOIN SODIUM EXTENDED 100 MG PO CAPS
200.0000 mg | ORAL_CAPSULE | Freq: Two times a day (BID) | ORAL | Status: DC
  Administered 2019-08-02 – 2019-08-06 (×8): 200 mg via ORAL
  Filled 2019-08-01 (×8): qty 2

## 2019-08-01 MED ORDER — ASPIRIN 81 MG PO CHEW
81.0000 mg | CHEWABLE_TABLET | Freq: Every day | ORAL | Status: DC
  Administered 2019-08-02 – 2019-08-06 (×5): 81 mg via ORAL
  Filled 2019-08-01 (×5): qty 1

## 2019-08-01 MED ORDER — POLYETHYLENE GLYCOL 3350 17 G PO PACK
17.0000 g | PACK | Freq: Every day | ORAL | Status: DC
  Administered 2019-08-02 – 2019-08-03 (×2): 17 g via ORAL
  Filled 2019-08-01 (×2): qty 1

## 2019-08-01 MED ORDER — ACETAMINOPHEN 325 MG PO TABS: 650.0000 mg | ORAL_TABLET | Freq: Four times a day (QID) | ORAL | Status: DC | PRN

## 2019-08-01 MED ORDER — HEPARIN SODIUM (PORCINE) 5000 UNIT/ML IJ SOLN
5000.0000 [IU] | Freq: Three times a day (TID) | INTRAMUSCULAR | Status: DC
  Administered 2019-08-01 – 2019-08-06 (×14): 5000 [IU] via SUBCUTANEOUS
  Filled 2019-08-01 (×15): qty 1

## 2019-08-01 MED ORDER — ASCORBIC ACID 500 MG PO TABS
500.0000 mg | ORAL_TABLET | Freq: Every day | ORAL | Status: DC
  Administered 2019-08-02 – 2019-08-06 (×5): 500 mg via ORAL
  Filled 2019-08-01 (×5): qty 1

## 2019-08-01 MED ORDER — ONDANSETRON HCL 4 MG PO TABS: 4.0000 mg | ORAL_TABLET | Freq: Four times a day (QID) | ORAL | Status: DC | PRN

## 2019-08-01 MED ORDER — SERTRALINE HCL 50 MG PO TABS
100.0000 mg | ORAL_TABLET | Freq: Two times a day (BID) | ORAL | Status: DC
  Administered 2019-08-02 – 2019-08-06 (×8): 100 mg via ORAL
  Filled 2019-08-01 (×8): qty 2

## 2019-08-01 MED ORDER — ACETAMINOPHEN 650 MG RE SUPP: 650.0000 mg | Freq: Four times a day (QID) | RECTAL | Status: DC | PRN

## 2019-08-01 MED ORDER — SODIUM CHLORIDE 0.9 % IV SOLN
200.0000 mg | Freq: Once | INTRAVENOUS | Status: AC
  Administered 2019-08-01: 200 mg via INTRAVENOUS
  Filled 2019-08-01: qty 40

## 2019-08-01 MED ORDER — BUPROPION HCL ER (XL) 300 MG PO TB24
300.0000 mg | ORAL_TABLET | Freq: Every day | ORAL | Status: DC
  Administered 2019-08-02 – 2019-08-04 (×3): 300 mg via ORAL
  Filled 2019-08-01 (×3): qty 1

## 2019-08-01 MED ORDER — SODIUM CHLORIDE 0.9% FLUSH
3.0000 mL | Freq: Two times a day (BID) | INTRAVENOUS | Status: DC
  Administered 2019-08-02 – 2019-08-03 (×2): 3 mL via INTRAVENOUS

## 2019-08-01 MED ORDER — SODIUM CHLORIDE 0.45 % IV SOLN: INTRAVENOUS | Status: DC

## 2019-08-01 MED ORDER — POLYETHYLENE GLYCOL 3350 17 G PO PACK: 17.0000 g | PACK | Freq: Every day | ORAL | Status: DC | PRN

## 2019-08-01 MED ORDER — ZINC SULFATE 220 (50 ZN) MG PO CAPS
220.0000 mg | ORAL_CAPSULE | Freq: Every day | ORAL | Status: DC
  Administered 2019-08-02 – 2019-08-06 (×5): 220 mg via ORAL
  Filled 2019-08-01 (×5): qty 1

## 2019-08-01 MED ORDER — SENNOSIDES-DOCUSATE SODIUM 8.6-50 MG PO TABS
1.0000 | ORAL_TABLET | Freq: Every day | ORAL | Status: DC
  Administered 2019-08-02 – 2019-08-04 (×3): 1 via ORAL
  Filled 2019-08-01 (×3): qty 1
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
## 2019-08-01 NOTE — Progress Notes (Signed)
Pt looked at me when I called his name and asked was he ok, he nodded yes. Attempted water and ask could he swallow his pills, pt stated "no" turned away and would not respond or open mouth. MD made aware, no po meds given. Will continue to monitor.
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
## 2019-08-01 NOTE — ED Triage Notes (Signed)
EMS called out for unresponsiveness.  Arousal to pain only.  CBG 359, BP 92/60, ST 100.  O2@2l /m n/c.  Diagnosed with PNA on 07/30/2019.

## 2019-08-02 LAB — COMPREHENSIVE METABOLIC PANEL
ALT: 19 U/L (ref 0–44)
AST: 33 U/L (ref 15–41)
Albumin: 2.6 g/dL — ABNORMAL LOW (ref 3.5–5.0)
Alkaline Phosphatase: 89 U/L (ref 38–126)
Anion gap: 10 (ref 5–15)
BUN: 27 mg/dL — ABNORMAL HIGH (ref 8–23)
CO2: 25 mmol/L (ref 22–32)
Calcium: 7.9 mg/dL — ABNORMAL LOW (ref 8.9–10.3)
Chloride: 115 mmol/L — ABNORMAL HIGH (ref 98–111)
Creatinine, Ser: 1.24 mg/dL (ref 0.61–1.24)
GFR calc Af Amer: >60 mL/min (ref >60)
GFR calc non Af Amer: >60 mL/min (ref >60)
Glucose, Bld: 157 mg/dL — ABNORMAL HIGH (ref 70–99)
Potassium: 4.2 mmol/L (ref 3.5–5.1)
Sodium: 150 mmol/L — ABNORMAL HIGH (ref 135–145)
Total Bilirubin: 0.5 mg/dL (ref 0.3–1.2)
Total Protein: 5.9 g/dL — ABNORMAL LOW (ref 6.5–8.1)

## 2019-08-02 LAB — RENAL FUNCTION PANEL
Albumin: 2.6 g/dL — ABNORMAL LOW (ref 3.5–5.0)
Anion gap: 8 (ref 5–15)
BUN: 30 mg/dL — ABNORMAL HIGH (ref 8–23)
CO2: 26 mmol/L (ref 22–32)
Calcium: 7.2 mg/dL — ABNORMAL LOW (ref 8.9–10.3)
Chloride: 118 mmol/L — ABNORMAL HIGH (ref 98–111)
Creatinine, Ser: 1.44 mg/dL — ABNORMAL HIGH (ref 0.61–1.24)
GFR calc Af Amer: 58 mL/min — ABNORMAL LOW (ref >60)
GFR calc non Af Amer: 50 mL/min — ABNORMAL LOW (ref >60)
Glucose, Bld: 150 mg/dL — ABNORMAL HIGH (ref 70–99)
Phosphorus: 2.6 mg/dL (ref 2.5–4.6)
Potassium: 3.8 mmol/L (ref 3.5–5.1)
Sodium: 152 mmol/L — ABNORMAL HIGH (ref 135–145)

## 2019-08-02 LAB — CBC WITH DIFFERENTIAL/PLATELET
Abs Immature Granulocytes: 0.03 10*3/uL (ref 0.00–0.07)
Basophils Absolute: 0 10*3/uL (ref 0.0–0.1)
Basophils Relative: 0 %
Eosinophils Absolute: 0 10*3/uL (ref 0.0–0.5)
Eosinophils Relative: 0 %
HCT: 39.4 % (ref 39.0–52.0)
Hemoglobin: 12.4 g/dL — ABNORMAL LOW (ref 13.0–17.0)
Immature Granulocytes: 1 %
Lymphocytes Relative: 22 %
Lymphs Abs: 1.1 10*3/uL (ref 0.7–4.0)
MCH: 30 pg (ref 26.0–34.0)
MCHC: 31.5 g/dL (ref 30.0–36.0)
MCV: 95.4 fL (ref 80.0–100.0)
Monocytes Absolute: 0.3 10*3/uL (ref 0.1–1.0)
Monocytes Relative: 7 %
Neutro Abs: 3.7 10*3/uL (ref 1.7–7.7)
Neutrophils Relative %: 70 %
Platelets: 91 10*3/uL — ABNORMAL LOW (ref 150–400)
RBC: 4.13 MIL/uL — ABNORMAL LOW (ref 4.22–5.81)
RDW: 13.3 % (ref 11.5–15.5)
WBC: 5.2 10*3/uL (ref 4.0–10.5)
nRBC: 0 % (ref 0.0–0.2)

## 2019-08-02 LAB — BASIC METABOLIC PANEL
Anion gap: 4 — ABNORMAL LOW (ref 5–15)
BUN: 26 mg/dL — ABNORMAL HIGH (ref 8–23)
CO2: 25 mmol/L (ref 22–32)
Calcium: 7.4 mg/dL — ABNORMAL LOW (ref 8.9–10.3)
Chloride: 115 mmol/L — ABNORMAL HIGH (ref 98–111)
Creatinine, Ser: 1.2 mg/dL (ref 0.61–1.24)
GFR calc Af Amer: >60 mL/min (ref >60)
GFR calc non Af Amer: >60 mL/min (ref >60)
Glucose, Bld: 126 mg/dL — ABNORMAL HIGH (ref 70–99)
Potassium: 4 mmol/L (ref 3.5–5.1)
Sodium: 144 mmol/L (ref 135–145)

## 2019-08-02 LAB — GLUCOSE, CAPILLARY
Glucose-Capillary: 113 mg/dL — ABNORMAL HIGH (ref 70–99)
Glucose-Capillary: 121 mg/dL — ABNORMAL HIGH (ref 70–99)
Glucose-Capillary: 145 mg/dL — ABNORMAL HIGH (ref 70–99)
Glucose-Capillary: 169 mg/dL — ABNORMAL HIGH (ref 70–99)

## 2019-08-02 LAB — HEMOGLOBIN A1C
Hgb A1c MFr Bld: 7.4 % — ABNORMAL HIGH (ref 4.8–5.6)
Mean Plasma Glucose: 165.68 mg/dL

## 2019-08-02 LAB — FERRITIN: Ferritin: 354 ng/mL — ABNORMAL HIGH (ref 24–336)

## 2019-08-02 LAB — SARS CORONAVIRUS 2 (TAT 6-24 HRS): SARS Coronavirus 2: NEGATIVE

## 2019-08-02 LAB — C-REACTIVE PROTEIN: CRP: 11.1 mg/dL — ABNORMAL HIGH (ref <1.0)

## 2019-08-02 MED ORDER — SODIUM CHLORIDE 0.9 % IV SOLN
INTRAVENOUS | Status: AC
  Filled 2019-08-02: qty 20

## 2019-08-02 MED ORDER — MEGESTROL ACETATE 625 MG/5ML PO SUSP: 625.0000 mg | Freq: Every day | ORAL | Status: DC

## 2019-08-02 MED ORDER — SODIUM CHLORIDE 0.9 % IV SOLN
2.0000 g | INTRAVENOUS | Status: DC
  Administered 2019-08-02 – 2019-08-04 (×3): 2 g via INTRAVENOUS
  Filled 2019-08-02 (×2): qty 20

## 2019-08-02 NOTE — Progress Notes (Signed)
Water was left at bedside, pt noted to drink full cup of water while I was not in room. Also followed commands during bath when asking to turn side to side. Condom cath was placed d/t incontinent episode in bed.

## 2019-08-02 NOTE — Progress Notes (Signed)
Patient Demographics:    [name], is a 69 y.o. male, DOB - 1951/02/13, TKZ:[id number]  Admit date - 08/01/2019   Admitting Physician Fumiye Lubben Denton Brick, MD  Outpatient Primary MD for the patient is Chris Donna, MD (Inactive)  LOS - 1   Chief Complaint  Patient presents with  . 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 male resident of SNF facility with history of TBI in 1996, dementia, seizure disorder, HTN, HLD and DM who is usually not very communicative at baseline and usually gets around with wheelchair admitted on 5/73/2202 with metabolic encephalopathy and concerns for pneumonia and need to rule out Covid related pneumonia  A/p   1)Multifocal pneumonia bacterial versus Covid related--- continue IV Rocephin and azithromycin for possible bacterial pneumonia, mucolytics] dilators as ordered -COVID-19 testing negative however given chest x-ray findings concerning for possible Covid related pneumonia we will repeat Covid test   2) Covid PUI--given chest x-ray findings and elevated inflammatory markers patient received 1 dose of IV remdesivir - -hold off on further remdesivir pending repeat COVID-19 test COVID-19 testing negative however given chest x-ray findings concerning for possible Covid related pneumonia we will repeat Covid test  --Inflammatory markers reveal -CRP  11  >>>  -Ferritin  360   >>> 354 >>>   fibrinogen of 478 which is borderline high,    -D-dimer is 1.29, calcitonin 0.15, LDH is borderline high at 214 -Trend inflammatory markers  3)Hypernatremia--sodium is down to 150 from 156, suspect related to dehydration,, received normal saline boluses in the ED, continue IV fluids to half-normal saline, serial BMP, adjust IV fluid rate and consistently based on available lab data -Mentation is improving as hydration and sodium levels is improved  4) history of TBI in 1996 and dementia, stable, may use trazodone as needed for sleep, continue Zoloft 100 mg twice daily, Remeron and Depakote as ordered  -Overall more awake and more interactive  5)DM2-no recent A1c available, anticipate worsening glycemic control with steroids, sliding scale insulin as ordered  6)Social/Ethics--- patient is a DNR as per current available records -Left Voicemail for   Sister Joycie Peek [phone]  7)seizure disorder--no seizure reported recently, continue Keppra, may use lorazepam as needed, continue Dilantin and Depakote--- consider weaning off Wellbutrin as it tends to lower seizure threshold  8)AKI----acute kidney injury due to dehydration and poor oral intake in the setting of pneumonia, creatinine on admission= 2.04,   baseline creatinine = no recent baseline available   , creatinine is now= 1.24 after hydration     , renally adjust medications, avoid nephrotoxic agents/dehydration/hypotension   Disposition/Need for in-Hospital Stay- patient unable to be discharged at this time due to bilateral multifocal pneumonia acquiring IV antibiotics and AKI requiring IV fluids   Code Status : DNR  Family Communication: Left Voicemail for   Sister [name] [phone]  Disposition Plan  : TBD  Consults  :  Na  DVT Prophylaxis  :   - Heparin (Platelets < 100k) - SCDs   Lab Results  Component Value Date   PLT 91 (L) 08/02/2019   Inpatient Medications  Scheduled Meds: . albuterol  2 puff Inhalation QID  . amLODipine  10 mg Oral Daily  .  vitamin C  500 mg Oral Daily  . aspirin  81 mg Oral Daily  . buPROPion  300 mg Oral Daily  . cholecalciferol  2,000 Units Oral Daily  . divalproex  500 mg Oral Q8H  . famotidine  20 mg Oral Daily  . guaiFENesin  600 mg Oral BID  . heparin  5,000 Units Subcutaneous Q8H  . insulin aspart  0-15 Units Subcutaneous TID WC  . insulin aspart  0-5 Units Subcutaneous QHS  . levETIRAcetam  500 mg Oral BID  . LORazepam  0.5 mg Oral Q8H  . methylPREDNISolone (SOLU-MEDROL) injection  40 mg Intravenous Q12H  . mirtazapine  30 mg Oral QHS  . multivitamin with minerals  1 tablet Oral Daily  . phenytoin  200 mg Oral BID  . polyethylene glycol  17 g Oral Daily  . senna-docusate  1 tablet Oral Daily  . sertraline  100 mg Oral BID  . sodium chloride flush  3 mL Intravenous Q12H  . zinc sulfate  220 mg Oral Daily   Continuous Infusions: . sodium chloride 125 mL/hr at 08/02/19 1625  . sodium chloride    . azithromycin 500 mg (08/01/19 2049)  . cefTRIAXone (ROCEPHIN)  IV     PRN Meds:.sodium chloride, acetaminophen **OR** acetaminophen, chlorpheniramine-HYDROcodone, guaiFENesin-dextromethorphan, ondansetron **OR** ondansetron (ZOFRAN) IV, polyethylene glycol, sodium chloride flush, traZODone    Anti-infectives (From admission, onward)   Start     Dose/Rate Route Frequency Ordered Stop   08/02/19 1930  cefTRIAXone (ROCEPHIN) 2 g in sodium chloride 0.9 % 100 mL IVPB     2 g 200 mL/hr over 30 Minutes Intravenous Every 24 hours 08/02/19 0911     08/02/19 1000  remdesivir 100 mg in sodium chloride 0.9 % 100 mL IVPB  Status:  Discontinued     100 mg 200 mL/hr over 30 Minutes Intravenous Daily 08/01/19 1625 08/02/19 0909   08/01/19 2000  azithromycin (ZITHROMAX) 500 mg in sodium chloride 0.9 % 250 mL IVPB     500 mg 250 mL/hr over 60 Minutes Intravenous Every 24 hours 08/01/19 1907     08/01/19 1930  cefTRIAXone (ROCEPHIN) 1 g in sodium chloride 0.9 % 100 mL IVPB  Status:  Discontinued     1 g 200  mL/hr over 30 Minutes Intravenous Every 24 hours 08/01/19 1907 08/02/19 0911   08/01/19 1630  remdesivir 200 mg in sodium chloride 0.9% 250 mL IVPB     200 mg 580 mL/hr over 30 Minutes Intravenous Once 08/01/19 1625 08/01/19 1749        Objective:   Vitals:   08/01/19 2208 08/02/19 0503 08/02/19 0711 08/02/19 1441  BP: (!) 145/98 (!) 102/56  94/61  Pulse: 95 85  87  Resp: 20 (!) 22  20  Temp: (!) 100.8 F (38.2 C) 98.5 F (36.9 C)  98.1 F (36.7 C)  TempSrc: Oral Oral  Oral  SpO2: 100% 99% 97% 100%    Wt Readings from Last 3 Encounters:  07/05/15 75.8 kg  06/29/14 74.8 kg  06/01/14 74.8 kg  Intake/Output Summary (Last 24 hours) at 08/02/2019 1814 Last data filed at 08/02/2019 1441 Gross per 24 hour  Intake 1528.47 ml  Output 850 ml  Net 678.47 ml     Physical Exam  Gen:- Awake Alert, more interactive HEENT:- Export.AT, No sclera icterus Neck-Supple Neck,No JVD,.  Lungs-improving air movement , few scattered rhonchi CV- S1, S2 normal, regular  Abd-  +ve B.Sounds, Abd Soft, No tenderness,    Extremity/Skin:- No  edema, pedal pulses present Psych-baseline cognitive and memory deficits Neuro-generalized weakness, no new focal deficits, no tremors, able to follow some commands, moving around in bed and turning from side to side independently   Data Review:   Micro Results Recent Results (from the past 240 hour(s))  SARS CORONAVIRUS 2 (TAT 6-24 HRS) Nasopharyngeal Nasopharyngeal Swab     Status: None   Collection Time: 08/01/19  2:03 PM   Specimen: Nasopharyngeal Swab  Result Value Ref Range Status   SARS Coronavirus 2 NEGATIVE NEGATIVE Final    Comment: (NOTE) SARS-CoV-2 target nucleic acids are NOT DETECTED. The SARS-CoV-2 RNA is generally detectable in upper and lower respiratory specimens during the acute phase of infection. Negative results do not preclude SARS-CoV-2 infection, do not rule out co-infections with other pathogens, and should not be used as  the sole basis for treatment or other patient management decisions. Negative results must be combined with clinical observations, patient history, and epidemiological information. The expected result is Negative. Fact Sheet for Patients: HairSlick.no Fact Sheet for Healthcare Providers: quierodirigir.com This test is not yet approved or cleared by the [place] FDA and  has been authorized for detection and/or diagnosis of SARS-CoV-2 by FDA under an Emergency Use Authorization (EUA). This EUA will remain  in effect (meaning this test can be used) for the duration of the COVID-19 declaration under Section 56 4(b)(1) of the Act, 21 U.S.C. section 360bbb-3(b)(1), unless the authorization is terminated or revoked sooner. Performed at [facility], [address], [place], [place] [address]     Radiology Reports CT Head Wo Contrast  Result Date: 08/01/2019 CLINICAL DATA:  Unresponsiveness.  History of seizure EXAM: CT HEAD WITHOUT CONTRAST TECHNIQUE: Contiguous axial images were obtained from the base of the skull through the vertex without intravenous contrast. COMPARISON:  06/15/2019 FINDINGS: Brain: Chronic encephalomalacia in the left frontotemporal lobe and right cerebellum, unchanged. Stable ventricular size and configuration. No evidence of an acute infarction, intracranial hemorrhage, mass lesion, or mass effect. Scattered low-density changes within the periventricular and subcortical white matter compatible with chronic microvascular ischemic change. Mild diffuse cerebral volume loss. Vascular: Mild atherosclerotic calcifications involving the large vessels of the skull base. No unexpected hyperdense vessel. Skull: Normal. Negative for fracture or focal lesion. Sinuses/Orbits: No acute finding. Other: Chronic debris-filled left external auditory canal. IMPRESSION: 1. No acute intracranial abnormality. 2. 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 Chronic atrophy and small vessel ischemic changes, stable in appearance from prior. Electronically Signed   By: Duanne Guess D.O.   On: 08/01/2019 13:22   DG CHEST PORT 1 VIEW  Result Date: 08/01/2019 CLINICAL DATA:  Acute metabolic encephalopathy. Unresponsiveness. Reportedly diagnosed with pneumonia on 07/30/2019. Pending COVID-19 test. EXAM: PORTABLE CHEST 1 VIEW COMPARISON:  06/15/2019 FINDINGS: Cardiac silhouette normal in size.  No mediastinal or hilar masses. Airspace opacities are noted in both lungs, more subtly on the left and more confluent in the right mid lung. No evidence of pulmonary edema. No pleural effusion or pneumothorax.  Skeletal structures are grossly intact. IMPRESSION: 1. Bilateral airspace lung opacities, most confluent in the right mid lung. Suspect multifocal pneumonia. Pattern is consistent with COVID-19 infection. Electronically Signed   By: Amie Portland M.D.   On: 08/01/2019 14:51     CBC Recent Labs  Lab 08/01/19 1220 08/02/19 0743  WBC 4.4 5.2  HGB 13.2 12.4*  HCT 42.7 39.4  PLT 99* 91*  MCV 97.0 95.4  MCH 30.0 30.0  MCHC 30.9 31.5  RDW 13.3 13.3  LYMPHSABS 1.6 1.1  MONOABS 0.6 0.3  EOSABS 0.0 0.0  BASOSABS 0.0 0.0    Chemistries  Recent Labs  Lab 08/01/19 1220 08/01/19 2314 08/02/19 0743  NA 156* 152* 150*  K 4.0 3.8 4.2  CL 118* 118* 115*  CO2 29 26 25   GLUCOSE 231* 150* 157*  BUN 42* 30* 27*  CREATININE 2.04* 1.44* 1.24  CALCIUM 7.8* 7.2* 7.9*  AST  --   --  33  ALT  --   --  19  ALKPHOS  --   --  89  BILITOT  --   --  0.5   ------------------------------------------------------------------------------------------------------------------ No results for input(s): CHOL, HDL, LDLCALC, TRIG, CHOLHDL, LDLDIRECT in the last 72 hours.  Lab Results  Component Value Date   HGBA1C 7.4 (H) 08/01/2019    ------------------------------------------------------------------------------------------------------------------ No results for input(s): TSH, T4TOTAL, T3FREE, THYROIDAB in the last 72 hours.  Invalid input(s): FREET3 ------------------------------------------------------------------------------------------------------------------ Recent Labs    08/01/19 1706 08/02/19 0743  FERRITIN 360* 354*    Coagulation profile No results for input(s): INR, PROTIME in the last 168 hours.  Recent Labs    08/01/19 1706  DDIMER 1.29*    Cardiac Enzymes No results for input(s): CKMB, TROPONINI, MYOGLOBIN in the last 168 hours.  Invalid input(s): CK ------------------------------------------------------------------------------------------------------------------ No results found for: BNP   08/03/19 M.D on 08/02/2019 at 6:14 PM  Go to [email or web address] - for contact info  [facility] - Office  [phone]

## 2019-08-02 NOTE — Plan of Care (Signed)

## 2019-08-03 LAB — RENAL FUNCTION PANEL
Albumin: 2.9 g/dL — ABNORMAL LOW (ref 3.5–5.0)
Anion gap: 14 (ref 5–15)
BUN: 25 mg/dL — ABNORMAL HIGH (ref 8–23)
CO2: 20 mmol/L — ABNORMAL LOW (ref 22–32)
Calcium: 8.1 mg/dL — ABNORMAL LOW (ref 8.9–10.3)
Chloride: 114 mmol/L — ABNORMAL HIGH (ref 98–111)
Creatinine, Ser: 1.21 mg/dL (ref 0.61–1.24)
GFR calc Af Amer: >60 mL/min (ref >60)
GFR calc non Af Amer: >60 mL/min (ref >60)
Glucose, Bld: 143 mg/dL — ABNORMAL HIGH (ref 70–99)
Phosphorus: 2.7 mg/dL (ref 2.5–4.6)
Potassium: 4.7 mmol/L (ref 3.5–5.1)
Sodium: 148 mmol/L — ABNORMAL HIGH (ref 135–145)

## 2019-08-03 LAB — GLUCOSE, CAPILLARY
Glucose-Capillary: 113 mg/dL — ABNORMAL HIGH (ref 70–99)
Glucose-Capillary: 204 mg/dL — ABNORMAL HIGH (ref 70–99)
Glucose-Capillary: 79 mg/dL (ref 70–99)

## 2019-08-03 LAB — SARS CORONAVIRUS 2 (TAT 6-24 HRS): SARS Coronavirus 2: NEGATIVE

## 2019-08-03 MED ORDER — ALBUTEROL SULFATE HFA 108 (90 BASE) MCG/ACT IN AERS: 2.0000 | INHALATION_SPRAY | RESPIRATORY_TRACT | Status: DC | PRN

## 2019-08-03 MED ORDER — BISACODYL 10 MG RE SUPP
10.0000 mg | Freq: Once | RECTAL | Status: AC
  Administered 2019-08-03: 10 mg via RECTAL
  Filled 2019-08-03: qty 1

## 2019-08-03 MED ORDER — POLYETHYLENE GLYCOL 3350 17 G PO PACK
17.0000 g | PACK | Freq: Two times a day (BID) | ORAL | Status: DC
  Administered 2019-08-03 – 2019-08-06 (×6): 17 g via ORAL
  Filled 2019-08-03 (×6): qty 1

## 2019-08-03 NOTE — Care Management (Signed)
[facility] unable to accept pt back today due to needing a quarantine bed. TOC to follow up tomorrow.

## 2019-08-03 NOTE — Progress Notes (Signed)
Patient Demographics:    [name], is a 69 y.o. male, DOB - [birth year], FKC:[id number]  Admit date - 08/01/2019   Admitting Physician Elinor Kleine Denton Brick, MD  Outpatient Primary MD for the patient is [name], MD (Inactive)  LOS - 2   Chief Complaint  Patient presents with  . 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 male resident of SNF facility with history of TBI in 1996, dementia, seizure disorder, HTN, HLD and DM who is usually not very communicative at baseline and usually gets around with wheelchair admitted on 7/49/4496 with metabolic encephalopathy and concerns for pneumonia and need to rule out Covid related pneumonia  A/p  1)Multifocal Pneumonia Bacterial Versus Covid related--- --clinically improving, dyspnea and cough improving --- continue IV Rocephin and azithromycin for bacterial pneumonia, mucolytics] dilators as ordered -COVID-19 testing negative x 2    2) Covid PUI--   chest x-ray findings and elevated inflammatory markers noted, -Patient received remdesivir x1 dose, -COVID-19 test x2 -, --Inflammatory markers revealed -CRP  11  >>>  -Ferritin  360   >>> 354 >>>   fibrinogen of 478 which is borderline high,  -D-dimer is 1.29, calcitonin 0.15, LDH is borderline high at 214  3)Hypernatremia--sodium is down to 150 from 156, suspect related to dehydration,, received normal saline boluses in the ED, continue IV fluids to half-normal saline,  serial BMP, adjust IV fluid rate and consistently based on available lab data -Mentation is improving as hydration and sodium levels is improved  4)History of TBI in 1996 and dementia, stable, may use trazodone as needed for sleep, continue Zoloft 100 mg twice daily, Remeron and Depakote as ordered  -Overall more awake and more interactive  5)DM2-no recent A1c available, anticipate worsening glycemic control with steroids, sliding scale insulin as ordered  6)Social/Ethics--- patient is a DNR as per current available records -Left Voicemail for   Sister Joycie Peek [phone]  7)Seizure disorder--no seizure reported recently, continue Keppra, may use lorazepam as needed, continue Dilantin and Depakote--- consider weaning off Wellbutrin as it tends to lower seizure threshold  8)AKI----acute kidney injury due to dehydration and poor oral intake in the setting of pneumonia, creatinine on admission= 2.04,   baseline creatinine = no recent baseline available   , creatinine is now= 1.24 after hydration     , renally adjust medications, avoid nephrotoxic agents/dehydration/hypotension  9)Constipation----fecal impaction noted, manual disimpaction done--Dulcolax suppository and MiraLAX as ordered  Disposition/Need for in-Hospital Stay- patient unable to be discharged at this time due to bilateral multifocal pneumonia acquiring IV antibiotics and AKI requiring IV fluids -Patient's facility unable to take him back until 08/04/2019 due to the need to have a "quarantine" room for him available upon return to facility  Code Status : DNR  Family Communication:  Left Voicemail for   Sister [name] [phone]  Disposition Plan  : TBD  Consults  :  Na  DVT Prophylaxis  :   - Heparin (Platelets < 100k) - SCDs   Lab Results  Component Value Date   PLT 91 (L) 08/02/2019   Inpatient Medications  Scheduled Meds: . amLODipine  10 mg Oral Daily  . vitamin C  500 mg Oral Daily  . aspirin  81 mg  Oral Daily  . bisacodyl  10 mg Rectal Once  . buPROPion  300 mg Oral Daily  . cholecalciferol  2,000 Units Oral Daily  . divalproex  500 mg Oral Q8H  . famotidine  20 mg Oral Daily  . guaiFENesin  600 mg Oral BID  . heparin  5,000 Units Subcutaneous Q8H  . insulin aspart  0-15 Units Subcutaneous TID WC  . insulin aspart  0-5 Units Subcutaneous QHS  . levETIRAcetam  500 mg Oral BID  . LORazepam  0.5 mg Oral Q8H  . methylPREDNISolone (SOLU-MEDROL) injection  40 mg Intravenous Q12H  . mirtazapine  30 mg Oral QHS  . multivitamin with minerals  1 tablet Oral Daily  . phenytoin  200 mg Oral BID  . polyethylene glycol  17 g Oral BID  . senna-docusate  1 tablet Oral Daily  . sertraline  100 mg Oral BID  . sodium chloride flush  3 mL Intravenous Q12H  . zinc sulfate  220 mg Oral Daily   Continuous Infusions: . sodium chloride 50 mL/hr at 08/02/19 1852  . sodium chloride    . azithromycin 500 mg (08/02/19 2334)  . cefTRIAXone (ROCEPHIN)  IV 2 g (08/02/19 1853)   PRN Meds:.sodium chloride, acetaminophen **OR** acetaminophen, albuterol, chlorpheniramine-HYDROcodone, guaiFENesin-dextromethorphan, ondansetron **OR** ondansetron (ZOFRAN) IV, sodium chloride flush, traZODone    Anti-infectives (From admission, onward)   Start     Dose/Rate Route Frequency Ordered Stop   08/02/19 1930  cefTRIAXone (ROCEPHIN) 2 g in sodium chloride 0.9 % 100 mL IVPB     2 g 200 mL/hr over 30 Minutes Intravenous Every 24 hours 08/02/19 0911     08/02/19 1000  remdesivir 100 mg in sodium chloride 0.9 % 100 mL IVPB  Status:  Discontinued     100 mg 200 mL/hr over 30 Minutes Intravenous Daily 08/01/19 1625 08/02/19 0909   08/01/19 2000  azithromycin (ZITHROMAX) 500 mg in sodium chloride 0.9 % 250 mL IVPB     500 mg 250 mL/hr over 60 Minutes Intravenous Every 24 hours 08/01/19 1907     08/01/19 1930  cefTRIAXone (ROCEPHIN) 1 g in sodium chloride 0.9 % 100 mL IVPB  Status:  Discontinued     1 g 200 mL/hr over 30  Minutes Intravenous Every 24 hours 08/01/19 1907 08/02/19 0911   08/01/19 1630  remdesivir 200 mg in sodium chloride 0.9% 250 mL IVPB     200 mg 580 mL/hr over 30 Minutes Intravenous Once 08/01/19 1625 08/01/19 1749        Objective:   Vitals:   08/03/19 0843 08/03/19 1300 08/03/19 1342 08/03/19 1529  BP: 110/68 103/60 100/67   Pulse:  68 72   Resp:   20   Temp:  97.7 F (36.5 C) 98.3 F (36.8 C)   TempSrc:  Oral Oral   SpO2:  98% 100% 98%    Wt Readings from Last 3 Encounters:  07/05/15 75.8 kg  06/29/14 74.8 kg  06/01/14 74.8 kg     Intake/Output Summary (Last 24  hours) at 08/03/2019 1550 Last data filed at 08/03/2019 1323 Gross per 24 hour  Intake 2360.76 ml  Output 1600 ml  Net 760.76 ml     Physical Exam  Gen:- Awake Alert, more interactive HEENT:- [place].AT, No sclera icterus Neck-Supple Neck,No JVD,.  Lungs-improving air movement , few scattered rhonchi CV- S1, S2 normal, regular  Abd-  +ve B.Sounds, Abd Soft, No tenderness,    Extremity/Skin:- No  edema, pedal pulses present Psych-baseline cognitive and memory deficits Neuro-generalized weakness, no new focal deficits, no tremors, able to answer questions, talking more, moving around in bed and turning from side to side independently   Data Review:   Micro Results Recent Results (from the past 240 hour(s))  SARS CORONAVIRUS 2 (TAT 6-24 HRS) Nasopharyngeal Nasopharyngeal Swab     Status: None   Collection Time: 08/01/19  2:03 PM   Specimen: Nasopharyngeal Swab  Result Value Ref Range Status   SARS Coronavirus 2 NEGATIVE NEGATIVE Final    Comment: (NOTE) SARS-CoV-2 target nucleic acids are NOT DETECTED. The SARS-CoV-2 RNA is generally detectable in upper and lower respiratory specimens during the acute phase of infection. Negative results do not preclude SARS-CoV-2 infection, do not rule out co-infections with other pathogens, and should not be used as the sole basis for treatment or other patient  management decisions. Negative results must be combined with clinical observations, patient history, and epidemiological information. The expected result is Negative. Fact Sheet for Patients: HairSlick.no Fact Sheet for Healthcare Providers: quierodirigir.com This test is not yet approved or cleared by the [place] FDA and  has been authorized for detection and/or diagnosis of SARS-CoV-2 by FDA under an Emergency Use Authorization (EUA). This EUA will remain  in effect (meaning this test can be used) for the duration of the COVID-19 declaration under Section 56 4(b)(1) of the Act, 21 U.S.C. section 360bbb-3(b)(1), unless the authorization is terminated or revoked sooner. Performed at [facility], [address] [place], [place] [address]   SARS CORONAVIRUS 2 (TAT 6-24 HRS) Nasopharyngeal Nasopharyngeal Swab     Status: None   Collection Time: 08/02/19 11:07 AM   Specimen: Nasopharyngeal Swab  Result Value Ref Range Status   SARS Coronavirus 2 NEGATIVE NEGATIVE Final    Comment: (NOTE) SARS-CoV-2 target nucleic acids are NOT DETECTED. The SARS-CoV-2 RNA is generally detectable in upper and lower respiratory specimens during the acute phase of infection. Negative results do not preclude SARS-CoV-2 infection, do not rule out co-infections with other pathogens, and should not be used as the sole basis for treatment or other patient management decisions. Negative results must be combined with clinical observations, patient history, and epidemiological information. The expected result is Negative. Fact Sheet for Patients: HairSlick.no Fact Sheet for Healthcare Providers: quierodirigir.com This test is not yet approved or cleared by the [place] FDA and  has been authorized for detection and/or diagnosis of SARS-CoV-2 by FDA under an Emergency Use Authorization  (EUA). This EUA will remain  in effect (meaning this test can be used) for the duration of the COVID-19 declaration under Section 56 4(b)(1) of the Act, 21 U.S.C. section 360bbb-3(b)(1), unless the authorization is terminated or revoked sooner. Performed at [facility], [address], [place], [place] [address]     Radiology Reports CT Head Wo Contrast  Result Date: 08/01/2019 CLINICAL DATA:  Unresponsiveness.  History of seizure EXAM: CT HEAD WITHOUT CONTRAST TECHNIQUE: Contiguous axial images were obtained from the base of the skull through the  vertex without intravenous contrast. COMPARISON:  06/15/2019 FINDINGS: Brain: Chronic encephalomalacia in the left frontotemporal lobe and right cerebellum, unchanged. Stable ventricular size and configuration. No evidence of an acute infarction, intracranial hemorrhage, mass lesion, or mass effect. Scattered low-density changes within the periventricular and subcortical white matter compatible with chronic microvascular ischemic change. Mild diffuse cerebral volume loss. Vascular: Mild atherosclerotic calcifications involving the large vessels of the skull base. No unexpected hyperdense vessel. Skull: Normal. Negative for fracture or focal lesion. Sinuses/Orbits: No acute finding. Other: Chronic debris-filled left external auditory canal. IMPRESSION: 1. No acute intracranial abnormality. 2. 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 Chronic atrophy and small vessel ischemic changes, stable in appearance from prior. Electronically Signed   By: Duanne Guess D.O.   On: 08/01/2019 13:22   DG CHEST PORT 1 VIEW  Result Date: 08/01/2019 CLINICAL DATA:  Acute metabolic encephalopathy. Unresponsiveness. Reportedly diagnosed with pneumonia on 07/30/2019. Pending COVID-19 test. EXAM: PORTABLE CHEST 1 VIEW COMPARISON:  06/15/2019 FINDINGS: Cardiac silhouette normal in size.  No mediastinal or hilar masses. Airspace  opacities are noted in both lungs, more subtly on the left and more confluent in the right mid lung. No evidence of pulmonary edema. No pleural effusion or pneumothorax. Skeletal structures are grossly intact. IMPRESSION: 1. Bilateral airspace lung opacities, most confluent in the right mid lung. Suspect multifocal pneumonia. Pattern is consistent with COVID-19 infection. Electronically Signed   By: Amie Portland M.D.   On: 08/01/2019 14:51     CBC Recent Labs  Lab 08/01/19 1220 08/02/19 0743  WBC 4.4 5.2  HGB 13.2 12.4*  HCT 42.7 39.4  PLT 99* 91*  MCV 97.0 95.4  MCH 30.0 30.0  MCHC 30.9 31.5  RDW 13.3 13.3  LYMPHSABS 1.6 1.1  MONOABS 0.6 0.3  EOSABS 0.0 0.0  BASOSABS 0.0 0.0    Chemistries  Recent Labs  Lab 08/01/19 1220 08/01/19 2314 08/02/19 0743 08/02/19 1916  NA 156* 152* 150* 144  K 4.0 3.8 4.2 4.0  CL 118* 118* 115* 115*  CO2 29 26 25 25   GLUCOSE 231* 150* 157* 126*  BUN 42* 30* 27* 26*  CREATININE 2.04* 1.44* 1.24 1.20  CALCIUM 7.8* 7.2* 7.9* 7.4*  AST  --   --  33  --   ALT  --   --  19  --   ALKPHOS  --   --  89  --   BILITOT  --   --  0.5  --    ------------------------------------------------------------------------------------------------------------------ No results for input(s): CHOL, HDL, LDLCALC, TRIG, CHOLHDL, LDLDIRECT in the last 72 hours.  Lab Results  Component Value Date   HGBA1C 7.4 (H) 08/01/2019   ------------------------------------------------------------------------------------------------------------------ No results for input(s): TSH, T4TOTAL, T3FREE, THYROIDAB in the last 72 hours.  Invalid input(s): FREET3 ------------------------------------------------------------------------------------------------------------------ Recent Labs    08/01/19 1706 08/02/19 0743  FERRITIN 360* 354*    Coagulation profile No results for input(s): INR, PROTIME in the last 168 hours.  Recent Labs    08/01/19 1706  DDIMER 1.29*     Cardiac Enzymes No results for input(s): CKMB, TROPONINI, MYOGLOBIN in the last 168 hours.  Invalid input(s): CK ------------------------------------------------------------------------------------------------------------------ No results found for: BNP   08/03/19 M.D on 08/03/2019 at 3:50 PM  Go to [email or web address] - for contact info  [facility] - Office  [phone]

## 2019-08-03 NOTE — NC FL2 (Addendum)
[place] MEDICAID FL2 LEVEL OF CARE SCREENING TOOL     IDENTIFICATION  [name] Birthdate: 1951/03/03 Sex: male Admission Date (Current Location): 08/01/2019  [place] and [place] Number:  [name] [id number] S Facility and Address:  [facility],  [address] [place] [address]      Provider Number: [id number]  Attending Physician Name and Address:  Shon Hale, MD  Relative Name and Phone Number:  [name] [phone]    Current Level of Care: Hospital Recommended Level of Care: Skilled Nursing Facility Prior Approval Number:    Date Approved/Denied:   PASRR Number:    Discharge Plan: SNF    Current Diagnoses: Patient Active Problem List   Diagnosis Date Noted  . Covid PUI/PNA (pneumonia)-Covid PUI 08/01/2019  . 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 Seizure ([facility]) 08/01/2019  . Dementia without behavioral disturbance ([facility]) 08/01/2019  . Metatarsal fracture 06/01/2014  . Abnormal weight loss 10/10/2012  . Anorexia nervosa 10/10/2012  . Dehydration 09/09/2012  . Dilantin toxicity 09/09/2012  . Encephalopathy, metabolic 09/09/2012  . Sepsis ([facility]) 09/09/2012  . Hypernatremia 09/09/2012    Orientation RESPIRATION BLADDER Height & Weight     Self  O2(2LPM) Continent Weight:   Height:     BEHAVIORAL SYMPTOMS/MOOD NEUROLOGICAL BOWEL NUTRITION STATUS  (N/A) (N/A) Continent Diet(HEART HEALTHY DIET)  AMBULATORY STATUS COMMUNICATION OF NEEDS Skin     Non-Verbally Normal                       Personal Care Assistance Level of Assistance  Total care Bathing Assistance: Maximum assistance     Total Care Assistance: Maximum assistance   Functional Limitations Info  Sight, Speech, Hearing Sight Info: Adequate Hearing Info: Adequate Speech Info: Impaired(NON-VERBAL)    SPECIAL CARE FACTORS FREQUENCY                       Contractures Contractures Info: Not present     Additional Factors Info  Code Status, Allergies, Isolation Precautions Code Status Info: DNR Allergies Info: NO KNOWN DRUG ALLERGIES         Current Medications (08/03/2019):  This is the current hospital active medication list Current Facility-Administered Medications  Medication Dose Route Frequency Provider Last Rate Last Admin  . 0.45 % sodium chloride infusion   Intravenous Continuous Shon Hale, MD 50 mL/hr at 08/02/19 1852 Rate Change at 08/02/19 1852  . 0.9 %  sodium chloride infusion  250 mL Intravenous PRN Emokpae, Courage, MD      . acetaminophen (TYLENOL) tablet 650 mg  650 mg Oral Q6H PRN Emokpae, Courage, MD       Or  . acetaminophen (TYLENOL) suppository 650 mg  650 mg Rectal Q6H PRN Emokpae, Courage, MD      . albuterol (VENTOLIN HFA) 108 (90 Base) MCG/ACT inhaler 2 puff  2 puff Inhalation QID Shon Hale, MD   2 puff at 08/03/19 1123  . amLODipine (NORVASC) tablet 10 mg  10 mg Oral Daily Mariea Clonts, Courage, MD   10 mg at 08/03/19 0846  . ascorbic acid (VITAMIN C) tablet 500 mg  500 mg Oral Daily Emokpae, Courage, MD   500 mg at 08/03/19 0848  . aspirin chewable tablet 81 mg  81 mg Oral Daily Emokpae, Courage, MD   81 mg at 08/03/19 0845  . azithromycin (ZITHROMAX) 500 mg in sodium chloride 0.9 % 250 mL IVPB  500 mg Intravenous Q24H Emokpae, Courage,  MD 250 mL/hr at 08/02/19 2334 500 mg at 08/02/19 2334  . buPROPion (WELLBUTRIN XL) 24 hr tablet 300 mg  300 mg Oral Daily Emokpae, Courage, MD   300 mg at 08/03/19 0845  . cefTRIAXone (ROCEPHIN) 2 g in sodium chloride 0.9 % 100 mL IVPB  2 g Intravenous Q24H Emokpae, Courage, MD 200 mL/hr at 08/02/19 1853 2 g at 08/02/19 1853  . chlorpheniramine-HYDROcodone (TUSSIONEX) 10-8 MG/5ML suspension 5 mL  5 mL Oral Q12H PRN Emokpae, Courage, MD      . cholecalciferol (VITAMIN D3) tablet 2,000 Units  2,000 Units Oral Daily Roxan Hockey, MD   2,000 Units at 08/03/19 0844  . divalproex (DEPAKOTE) DR tablet 500 mg  500 mg  Oral Q8H Emokpae, Courage, MD   500 mg at 08/02/19 1427  . famotidine (PEPCID) tablet 20 mg  20 mg Oral Daily Emokpae, Courage, MD   20 mg at 08/03/19 0846  . guaiFENesin (MUCINEX) 12 hr tablet 600 mg  600 mg Oral BID Denton Brick, Courage, MD   600 mg at 08/03/19 0848  . guaiFENesin-dextromethorphan (ROBITUSSIN DM) 100-10 MG/5ML syrup 10 mL  10 mL Oral Q4H PRN Emokpae, Courage, MD      . heparin injection 5,000 Units  5,000 Units Subcutaneous Q8H Denton Brick, Courage, MD   5,000 Units at 08/03/19 0553  . insulin aspart (novoLOG) injection 0-15 Units  0-15 Units Subcutaneous TID WC Roxan Hockey, MD   5 Units at 08/03/19 1232  . insulin aspart (novoLOG) injection 0-5 Units  0-5 Units Subcutaneous QHS Emokpae, Courage, MD      . levETIRAcetam (KEPPRA) tablet 500 mg  500 mg Oral BID Denton Brick, Courage, MD   500 mg at 08/03/19 0848  . LORazepam (ATIVAN) tablet 0.5 mg  0.5 mg Oral Q8H Emokpae, Courage, MD   0.5 mg at 08/02/19 1427  . methylPREDNISolone sodium succinate (SOLU-MEDROL) 40 mg/mL injection 40 mg  40 mg Intravenous Q12H Emokpae, Courage, MD   40 mg at 08/03/19 0553  . mirtazapine (REMERON) tablet 30 mg  30 mg Oral QHS Emokpae, Courage, MD      . multivitamin with minerals tablet 1 tablet  1 tablet Oral Daily Roxan Hockey, MD   1 tablet at 08/03/19 0848  . ondansetron (ZOFRAN) tablet 4 mg  4 mg Oral Q6H PRN Emokpae, Courage, MD       Or  . ondansetron (ZOFRAN) injection 4 mg  4 mg Intravenous Q6H PRN Emokpae, Courage, MD      . phenytoin (DILANTIN) ER capsule 200 mg  200 mg Oral BID Denton Brick, Courage, MD   200 mg at 08/03/19 0846  . polyethylene glycol (MIRALAX / GLYCOLAX) packet 17 g  17 g Oral Daily Emokpae, Courage, MD   17 g at 08/03/19 0843  . polyethylene glycol (MIRALAX / GLYCOLAX) packet 17 g  17 g Oral Daily PRN Emokpae, Courage, MD      . senna-docusate (Senokot-S) tablet 1 tablet  1 tablet Oral Daily Roxan Hockey, MD   1 tablet at 08/03/19 0847  . sertraline (ZOLOFT) tablet 100 mg   100 mg Oral BID Roxan Hockey, MD   100 mg at 08/03/19 0847  . sodium chloride flush (NS) 0.9 % injection 3 mL  3 mL Intravenous Q12H Emokpae, Courage, MD   3 mL at 08/03/19 0850  . sodium chloride flush (NS) 0.9 % injection 3 mL  3 mL Intravenous PRN Emokpae, Courage, MD      . traZODone (DESYREL) tablet 50 mg  50  mg Oral QHS PRN Emokpae, Courage, MD      . zinc sulfate capsule 220 mg  220 mg Oral Daily Emokpae, Courage, MD   220 mg at 08/03/19 0847     Discharge Medications: Please see discharge summary for a list of discharge medications.  Relevant Imaging Results:  Relevant Lab Results:   Additional Information    Theda Belfast, Era Skeen, RN

## 2019-08-03 NOTE — Progress Notes (Signed)
Assisting pt transfer rooms on unit. Pt reports having BM in chair during transport. Pt with large, hardened lump of stool at rectum upon transfer to bed. After removing stool, large impaction noted to be present in rectum. Gentle digital disimpaction done with successful removal of stool from rectum. Pt denies any pain at present. MD notified.

## 2019-08-04 DIAGNOSIS — J189 Pneumonia, unspecified organism: Secondary | ICD-10-CM | POA: Diagnosis present

## 2019-08-04 LAB — GLUCOSE, CAPILLARY
Glucose-Capillary: 118 mg/dL — ABNORMAL HIGH (ref 70–99)
Glucose-Capillary: 127 mg/dL — ABNORMAL HIGH (ref 70–99)
Glucose-Capillary: 156 mg/dL — ABNORMAL HIGH (ref 70–99)
Glucose-Capillary: 214 mg/dL — ABNORMAL HIGH (ref 70–99)
Glucose-Capillary: 90 mg/dL (ref 70–99)

## 2019-08-04 LAB — BASIC METABOLIC PANEL
Anion gap: 9 (ref 5–15)
BUN: 21 mg/dL (ref 8–23)
CO2: 27 mmol/L (ref 22–32)
Calcium: 8 mg/dL — ABNORMAL LOW (ref 8.9–10.3)
Chloride: 112 mmol/L — ABNORMAL HIGH (ref 98–111)
Creatinine, Ser: 1.18 mg/dL (ref 0.61–1.24)
GFR calc Af Amer: >60 mL/min (ref >60)
GFR calc non Af Amer: >60 mL/min (ref >60)
Glucose, Bld: 108 mg/dL — ABNORMAL HIGH (ref 70–99)
Potassium: 3.9 mmol/L (ref 3.5–5.1)
Sodium: 148 mmol/L — ABNORMAL HIGH (ref 135–145)

## 2019-08-04 MED ORDER — SENNOSIDES-DOCUSATE SODIUM 8.6-50 MG PO TABS
2.0000 | ORAL_TABLET | Freq: Every day | ORAL | Status: DC
  Administered 2019-08-04 – 2019-08-05 (×2): 2 via ORAL
  Filled 2019-08-04 (×2): qty 2

## 2019-08-04 MED ORDER — PREDNISONE 20 MG PO TABS
50.0000 mg | ORAL_TABLET | Freq: Every day | ORAL | Status: DC
  Administered 2019-08-05 – 2019-08-06 (×2): 50 mg via ORAL
  Filled 2019-08-04 (×2): qty 1

## 2019-08-04 MED ORDER — BISACODYL 10 MG RE SUPP
10.0000 mg | Freq: Once | RECTAL | Status: AC
  Administered 2019-08-04: 10 mg via RECTAL
  Filled 2019-08-04: qty 1

## 2019-08-04 MED ORDER — LACTULOSE 10 GM/15ML PO SOLN
30.0000 g | Freq: Once | ORAL | Status: AC
  Administered 2019-08-04: 30 g via ORAL
  Filled 2019-08-04: qty 60

## 2019-08-04 MED ORDER — BUPROPION HCL ER (XL) 150 MG PO TB24
150.0000 mg | ORAL_TABLET | Freq: Every day | ORAL | Status: DC
  Administered 2019-08-05 – 2019-08-06 (×2): 150 mg via ORAL
  Filled 2019-08-04 (×2): qty 1

## 2019-08-04 NOTE — Progress Notes (Signed)
Patient Demographics:    [name], is a 69 y.o. male, DOB - 1951/03/15, IRC:[id number]  Admit date - 08/01/2019   Admitting Physician Kennidi Yoshida Mariea Clonts, MD  Outpatient Primary MD for the patient is Butch Penny, MD (Inactive)  LOS - 3   Chief Complaint  Patient presents with  . Altered Mental Status        Subjective:    [employer] today has no fevers, no emesis,  No chest pain,   -Much more awake, eating and drinking better  Assessment  & Plan :    Principal Problem:   Pneumonia-community-acquired/Covid Test NEGATIVE  x 2 Active Problems:   Hypernatremia   Dehydration   Encephalopathy, metabolic   TBI (traumatic brain injury) -1996    Seizure (HCC)   Dementia without behavioral disturbance [facility])  Brief Summary:- 69 y.o. male resident of SNF facility with history of TBI in 1996, dementia, seizure disorder, HTN, HLD and DM who is usually not very communicative at baseline and usually gets around with wheelchair admitted on 08/01/2019 with metabolic encephalopathy and concerns for pneumonia -with negative COVID-19 test x2  A/p  1)Multifocal Pneumonia Bacterial --- COVID-19 test NEGATIVE X 2 ---clinically improving, dyspnea and cough improving -No significant hypoxia --- continue IV Rocephin and azithromycin for bacterial pneumonia, -Continue bronchodilators and mucolytics  COVID-19 test NEGATIVE X 2  2)Hypernatremia--sodium is down to 148 from 156, suspect related to dehydration,, received normal saline boluses in the ED, continue IV fluids to half-normal saline, serial BMP, adjust IV fluid rate and consistently based on available lab data -Mentation is improved significantly as hydration and sodium levels   improved  4)History of TBI in 1996 and dementia, stable, may use trazodone as needed for sleep, continue Zoloft 100 mg twice daily, Remeron and Depakote as ordered  -Overall more  awake and more interactive  5)DM2-no recent A1c available,   sliding scale insulin as ordered  6)Social/Ethics--- patient is a DNR as per current available records -Left Voicemail for   Sister [name] [phone]  7)Seizure disorder--no seizure reported recently, continue Keppra, may use lorazepam as needed, continue Dilantin and Depakote--- consider weaning off Wellbutrin as it tends to lower seizure threshold  8)AKI----acute kidney injury due to dehydration and poor oral intake in the setting of pneumonia, creatinine on admission= 2.04,   baseline creatinine = no recent baseline available   , creatinine is now= 1.1 after hydration     , renally adjust medications, avoid nephrotoxic agents/dehydration/hypotension  9)Constipation----fecal impaction noted, manual disimpaction done-- -overall improving, continue MiraLAX as ordered  Disposition/Need for in-Hospital Stay- patient unable to be discharged at this time due to bilateral multifocal pneumonia acquiring IV antibiotics and AKI requiring IV fluids -Patient's facility unable to take him back at this time due to the need to have a "quarantine" room for him available upon return to facility  Code Status : DNR  Family Communication: Left Voicemail for   Sister [name] [phone] multiple times --no call back yet from her  Disposition Plan  : TBD  Consults  :  Na  DVT Prophylaxis  :   - Heparin (Platelets < 100k) - SCDs   Lab Results  Component Value Date   PLT 91 (L) 08/02/2019  Inpatient Medications  Scheduled Meds: . amLODipine  10 mg Oral Daily  . vitamin C  500 mg Oral Daily  . aspirin  81 mg Oral Daily  . buPROPion  300 mg Oral Daily  . cholecalciferol  2,000 Units Oral Daily  . divalproex  500 mg Oral Q8H  . famotidine  20 mg Oral Daily  . guaiFENesin  600 mg Oral BID  . heparin  5,000 Units Subcutaneous Q8H  . insulin aspart  0-15 Units Subcutaneous TID WC  . insulin aspart  0-5 Units Subcutaneous QHS   . levETIRAcetam  500 mg Oral BID  . LORazepam  0.5 mg Oral Q8H  . methylPREDNISolone (SOLU-MEDROL) injection  40 mg Intravenous Q12H  . mirtazapine  30 mg Oral QHS  . multivitamin with minerals  1 tablet Oral Daily  . phenytoin  200 mg Oral BID  . polyethylene glycol  17 g Oral BID  . senna-docusate  1 tablet Oral Daily  . sertraline  100 mg Oral BID  . sodium chloride flush  3 mL Intravenous Q12H  . zinc sulfate  220 mg Oral Daily   Continuous Infusions: . sodium chloride 150 mL/hr at 08/04/19 0814  . sodium chloride    . azithromycin 500 mg (08/03/19 2331)  . cefTRIAXone (ROCEPHIN)  IV 2 g (08/03/19 2051)   PRN Meds:.sodium chloride, acetaminophen **OR** acetaminophen, albuterol, chlorpheniramine-HYDROcodone, guaiFENesin-dextromethorphan, ondansetron **OR** ondansetron (ZOFRAN) IV, sodium chloride flush, traZODone   Anti-infectives (From admission, onward)   Start     Dose/Rate Route Frequency Ordered Stop   08/02/19 1930  cefTRIAXone (ROCEPHIN) 2 g in sodium chloride 0.9 % 100 mL IVPB     2 g 200 mL/hr over 30 Minutes Intravenous Every 24 hours 08/02/19 0911     08/02/19 1000  remdesivir 100 mg in sodium chloride 0.9 % 100 mL IVPB  Status:  Discontinued     100 mg 200 mL/hr over 30 Minutes Intravenous Daily 08/01/19 1625 08/02/19 0909   08/01/19 2000  azithromycin (ZITHROMAX) 500 mg in sodium chloride 0.9 % 250 mL IVPB     500 mg 250 mL/hr over 60 Minutes Intravenous Every 24 hours 08/01/19 1907     08/01/19 1930  cefTRIAXone (ROCEPHIN) 1 g in sodium chloride 0.9 % 100 mL IVPB  Status:  Discontinued     1 g 200 mL/hr over 30 Minutes Intravenous Every 24 hours 08/01/19 1907 08/02/19 0911   08/01/19 1630  remdesivir 200 mg in sodium chloride 0.9% 250 mL IVPB     200 mg 580 mL/hr over 30 Minutes Intravenous Once 08/01/19 1625 08/01/19 1749       Objective:   Vitals:   08/03/19 1342 08/03/19 1529 08/03/19 2000 08/04/19 0517  BP: 100/67  100/62 (!) 103/58  Pulse: 72  72  80  Resp: 20  20 20   Temp: 98.3 F (36.8 C)  98 F (36.7 C) 98 F (36.7 C)  TempSrc: Oral   Oral  SpO2: 100% 98% 98% 98%    Wt Readings from Last 3 Encounters:  07/05/15 75.8 kg  06/29/14 74.8 kg  06/01/14 74.8 kg    Intake/Output Summary (Last 24 hours) at 08/04/2019 1318 Last data filed at 08/04/2019 0951 Gross per 24 hour  Intake 440 ml  Output 2750 ml  Net -2310 ml   Physical Exam Gen:- Awake Alert, more interactive HEENT:- [place].AT, No sclera icterus Neck-Supple Neck,No JVD,.  Lungs-improving air movement , no wheezing  CV- S1, S2 normal, regular  Abd-  +  ve B.Sounds, Abd Soft, No tenderness,    Extremity/Skin:- No  edema, pedal pulses present Psych-baseline cognitive and memory deficits Neuro-generalized weakness, no new focal deficits, no tremors, able to answer questions, talking more, sitting up in a chair-  Data Review:   Micro Results Recent Results (from the past 240 hour(s))  SARS CORONAVIRUS 2 (TAT 6-24 HRS) Nasopharyngeal Nasopharyngeal Swab     Status: None   Collection Time: 08/01/19  2:03 PM   Specimen: Nasopharyngeal Swab  Result Value Ref Range Status   SARS Coronavirus 2 NEGATIVE NEGATIVE Final    Comment: (NOTE) SARS-CoV-2 target nucleic acids are NOT DETECTED. The SARS-CoV-2 RNA is generally detectable in upper and lower respiratory specimens during the acute phase of infection. Negative results do not preclude SARS-CoV-2 infection, do not rule out co-infections with other pathogens, and should not be used as the sole basis for treatment or other patient management decisions. Negative results must be combined with clinical observations, patient history, and epidemiological information. The expected result is Negative. Fact Sheet for Patients: HairSlick.no Fact Sheet for Healthcare Providers: quierodirigir.com This test is not yet approved or cleared by the [place] FDA and  has been  authorized for detection and/or diagnosis of SARS-CoV-2 by FDA under an Emergency Use Authorization (EUA). This EUA will remain  in effect (meaning this test can be used) for the duration of the COVID-19 declaration under Section 56 4(b)(1) of the Act, 21 U.S.C. section 360bbb-3(b)(1), unless the authorization is terminated or revoked sooner. Performed at [facility], [address], [place], [place] [address]   SARS CORONAVIRUS 2 (TAT 6-24 HRS) Nasopharyngeal Nasopharyngeal Swab     Status: None   Collection Time: 08/02/19 11:07 AM   Specimen: Nasopharyngeal Swab  Result Value Ref Range Status   SARS Coronavirus 2 NEGATIVE NEGATIVE Final    Comment: (NOTE) SARS-CoV-2 target nucleic acids are NOT DETECTED. The SARS-CoV-2 RNA is generally detectable in upper and lower respiratory specimens during the acute phase of infection. Negative results do not preclude SARS-CoV-2 infection, do not rule out co-infections with other pathogens, and should not be used as the sole basis for treatment or other patient management decisions. Negative results must be combined with clinical observations, patient history, and epidemiological information. The expected result is Negative. Fact Sheet for Patients: HairSlick.no Fact Sheet for Healthcare Providers: quierodirigir.com This test is not yet approved or cleared by the [place] FDA and  has been authorized for detection and/or diagnosis of SARS-CoV-2 by FDA under an Emergency Use Authorization (EUA). This EUA will remain  in effect (meaning this test can be used) for the duration of the COVID-19 declaration under Section 56 4(b)(1) of the Act, 21 U.S.C. section 360bbb-3(b)(1), unless the authorization is terminated or revoked sooner. Performed at [facility], [address] [place], [place] [address]     Radiology Reports CT Head Wo Contrast  Result Date: 08/01/2019  CLINICAL DATA:  Unresponsiveness.  History of seizure EXAM: CT HEAD WITHOUT CONTRAST TECHNIQUE: Contiguous axial images were obtained from the base of the skull through the vertex without intravenous contrast. COMPARISON:  06/15/2019 FINDINGS: Brain: Chronic encephalomalacia in the left frontotemporal lobe and right cerebellum, unchanged. Stable ventricular size and configuration. No evidence of an acute infarction, intracranial hemorrhage, mass lesion, or mass effect. Scattered low-density changes within the periventricular and subcortical white matter compatible with chronic microvascular ischemic change. Mild diffuse cerebral volume loss. Vascular: Mild atherosclerotic calcifications involving the large vessels of the  skull base. No unexpected hyperdense vessel. Skull: Normal. Negative for fracture or focal lesion. Sinuses/Orbits: No acute finding. Other: Chronic debris-filled left external auditory canal. IMPRESSION: 1. No acute intracranial abnormality. 2. 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 Chronic atrophy and small vessel ischemic changes, stable in appearance from prior. Electronically Signed   By: Davina Poke D.O.   On: 08/01/2019 13:22   DG CHEST PORT 1 VIEW  Result Date: 08/01/2019 CLINICAL DATA:  Acute metabolic encephalopathy. Unresponsiveness. Reportedly diagnosed with pneumonia on 07/30/2019. Pending COVID-19 test. EXAM: PORTABLE CHEST 1 VIEW COMPARISON:  06/15/2019 FINDINGS: Cardiac silhouette normal in size.  No mediastinal or hilar masses. Airspace opacities are noted in both lungs, more subtly on the left and more confluent in the right mid lung. No evidence of pulmonary edema. No pleural effusion or pneumothorax. Skeletal structures are grossly intact. IMPRESSION: 1. Bilateral airspace lung opacities, most confluent in the right mid lung. Suspect multifocal pneumonia. Pattern is consistent with COVID-19 infection. Electronically Signed   By:  Lajean Manes M.D.   On: 08/01/2019 14:51     CBC Recent Labs  Lab 08/01/19 1220 08/02/19 0743  WBC 4.4 5.2  HGB 13.2 12.4*  HCT 42.7 39.4  PLT 99* 91*  MCV 97.0 95.4  MCH 30.0 30.0  MCHC 30.9 31.5  RDW 13.3 13.3  LYMPHSABS 1.6 1.1  MONOABS 0.6 0.3  EOSABS 0.0 0.0  BASOSABS 0.0 0.0    Chemistries  Recent Labs  Lab 08/01/19 2314 08/02/19 0743 08/02/19 1916 08/03/19 0849 08/04/19 0642  NA 152* 150* 144 148* 148*  K 3.8 4.2 4.0 4.7 3.9  CL 118* 115* 115* 114* 112*  CO2 26 25 25  20* 27  GLUCOSE 150* 157* 126* 143* 108*  BUN 30* 27* 26* 25* 21  CREATININE 1.44* 1.24 1.20 1.21 1.18  CALCIUM 7.2* 7.9* 7.4* 8.1* 8.0*  AST  --  33  --   --   --   ALT  --  19  --   --   --   ALKPHOS  --  89  --   --   --   BILITOT  --  0.5  --   --   --    ------------------------------------------------------------------------------------------------------------------ No results for input(s): CHOL, HDL, LDLCALC, TRIG, CHOLHDL, LDLDIRECT in the last 72 hours.  Lab Results  Component Value Date   HGBA1C 7.4 (H) 08/01/2019   ------------------------------------------------------------------------------------------------------------------ No results for input(s): TSH, T4TOTAL, T3FREE, THYROIDAB in the last 72 hours.  Invalid input(s): FREET3 ------------------------------------------------------------------------------------------------------------------ Recent Labs    08/01/19 1706 08/02/19 0743  FERRITIN 360* 354*    Coagulation profile No results for input(s): INR, PROTIME in the last 168 hours.  Recent Labs    08/01/19 1706  DDIMER 1.29*    Cardiac Enzymes No results for input(s): CKMB, TROPONINI, MYOGLOBIN in the last 168 hours.  Invalid input(s): CK ------------------------------------------------------------------------------------------------------------------ No results found for: BNP   Roxan Hockey M.D on 08/04/2019 at 1:18 PM  Go to [email or web address] - for  contact info  [facility] - Office  [phone]

## 2019-08-04 NOTE — Care Management Important Message (Signed)
Important Message  Patient Details  Name: [name] MRN: [id number] Date of Birth: 1950/12/30   Medicare Important Message Given:  Yes     Corey Harold 08/04/2019, 1:15 PM

## 2019-08-04 NOTE — Progress Notes (Signed)
Patient has been resting in bed peacefully. Patient feed himself breakfast. Patient condom cath was displaced and was cleaned up and replaced. Patient voices no complaints and shoes no signs of distress.

## 2019-08-05 LAB — GLUCOSE, CAPILLARY
Glucose-Capillary: 122 mg/dL — ABNORMAL HIGH (ref 70–99)
Glucose-Capillary: 140 mg/dL — ABNORMAL HIGH (ref 70–99)
Glucose-Capillary: 169 mg/dL — ABNORMAL HIGH (ref 70–99)
Glucose-Capillary: 182 mg/dL — ABNORMAL HIGH (ref 70–99)

## 2019-08-05 LAB — BASIC METABOLIC PANEL
Anion gap: 10 (ref 5–15)
BUN: 17 mg/dL (ref 8–23)
CO2: 27 mmol/L (ref 22–32)
Calcium: 8.1 mg/dL — ABNORMAL LOW (ref 8.9–10.3)
Chloride: 108 mmol/L (ref 98–111)
Creatinine, Ser: 1.08 mg/dL (ref 0.61–1.24)
GFR calc Af Amer: >60 mL/min (ref >60)
GFR calc non Af Amer: >60 mL/min (ref >60)
Glucose, Bld: 137 mg/dL — ABNORMAL HIGH (ref 70–99)
Potassium: 3.4 mmol/L — ABNORMAL LOW (ref 3.5–5.1)
Sodium: 145 mmol/L (ref 135–145)

## 2019-08-05 MED ORDER — LACTULOSE 10 GM/15ML PO SOLN
30.0000 g | Freq: Once | ORAL | Status: AC
  Administered 2019-08-05: 30 g via ORAL
  Filled 2019-08-05: qty 60

## 2019-08-05 MED ORDER — AZITHROMYCIN 250 MG PO TABS
500.0000 mg | ORAL_TABLET | Freq: Once | ORAL | Status: AC
  Administered 2019-08-05: 500 mg via ORAL
  Filled 2019-08-05: qty 2

## 2019-08-05 MED ORDER — POTASSIUM CHLORIDE CRYS ER 20 MEQ PO TBCR
40.0000 meq | EXTENDED_RELEASE_TABLET | ORAL | Status: AC
  Administered 2019-08-05: 40 meq via ORAL
  Filled 2019-08-05 (×2): qty 2

## 2019-08-05 MED ORDER — SODIUM CHLORIDE 0.9 % IV SOLN
1.0000 g | INTRAVENOUS | Status: DC
  Administered 2019-08-05: 1 g via INTRAVENOUS
  Filled 2019-08-05: qty 10

## 2019-08-05 NOTE — Progress Notes (Signed)
Patient Demographics:    [name], is a 69 y.o. male, DOB - 02-23-1951, JOA:[id number]  Admit date - 08/01/2019   Admitting Physician Anvay Tennis Denton Brick, MD  Outpatient Primary MD for the patient is Chris Donna, MD (Inactive)  LOS - 4   Chief Complaint  Patient presents with  . Altered Mental Status        Subjective:    Dynegy today has no fevers, no emesis,  No chest pain,   Had BM (hard stool)  Assessment  & Plan :    Principal Problem:   Pneumonia-community-acquired/Covid Test NEGATIVE  x 2 Active Problems:   Hypernatremia   Dehydration   Encephalopathy, metabolic   TBI (traumatic brain injury) -1996    Seizure ([place])   Dementia without behavioral disturbance [facility])  Brief Summary:- 69 y.o. male resident of SNF facility with history of TBI in 1996, dementia, seizure disorder, HTN, HLD and DM who is usually not very communicative at baseline and usually gets around with wheelchair admitted on 12/16/930 with metabolic encephalopathy and concerns for pneumonia -with negative COVID-19 test x2  A/p  1)Multifocal Pneumonia Bacterial --- COVID-19 test NEGATIVE X 2 ---clinically improving, dyspnea and cough improved -No  hypoxia --- continue IV Rocephin for bacterial pneumonia, -Okay to complete azithromycin last dose 08/05/2019 -Continue bronchodilators and mucolytics  COVID-19 test NEGATIVE X 2  2)Hypernatremia--sodium is down to 145 from 156, suspect related to dehydration,, received normal saline boluses in the ED, continue IV fluids to half-normal saline, serial BMP, adjust IV fluid rate and consistently based on available lab data -Mentation has improved significantly as hydration and sodium levels   improved  4)History of TBI in 1996 and dementia, stable, may use trazodone as needed for sleep, continue Zoloft 100 mg twice daily, Remeron and Depakote as ordered  -Overall more  awake and more interactive  5)DM2-no recent A1c available,   sliding scale insulin as ordered  6)Social/Ethics--- patient is a DNR as per current available records -Left Voicemail for   Sister [name] [phone]  7)Seizure disorder--no seizure reported recently, continue Keppra, may use lorazepam as needed, continue Dilantin and Depakote--- consider weaning off Wellbutrin as it tends to lower seizure threshold  8)AKI----acute kidney injury due to dehydration and poor oral intake in the setting of pneumonia, creatinine on admission= 2.04,   baseline creatinine = no recent baseline available   , creatinine is now= 1.0 after hydration     , renally adjust medications, avoid nephrotoxic agents/dehydration/hypotension  9)Constipation----fecal impaction noted, manual disimpaction done-- -overall improving with laxatives, continue MiraLAX as ordered  Disposition/Need for in-Hospital Stay- patient unable to be discharged at this time due to bilateral multifocal pneumonia acquiring IV antibiotics and AKI requiring IV fluids -Overall medically stable and ready for discharge to SNF facility when bed available -Patient's facility unable to take him back at this time due to the need to have a "quarantine" room for him available upon return to facility  Code Status : DNR  Family Communication: Left Voicemail for   Sister [name] [phone] multiple times --no call back yet from her  Disposition Plan  : -Overall medically stable and ready for discharge to SNF facility when bed available  Consults  :  Na  DVT Prophylaxis  :   - Heparin (Platelets < 100k) - SCDs   Lab Results  Component Value Date   PLT 91 (L) 08/02/2019   Inpatient Medications  Scheduled Meds: . amLODipine  10 mg Oral Daily  . vitamin C  500 mg Oral Daily  . aspirin  81 mg Oral Daily  . azithromycin  500 mg Oral Once  . buPROPion  150 mg Oral Daily  . cholecalciferol  2,000 Units Oral Daily  . divalproex  500  mg Oral Q8H  . famotidine  20 mg Oral Daily  . guaiFENesin  600 mg Oral BID  . heparin  5,000 Units Subcutaneous Q8H  . insulin aspart  0-15 Units Subcutaneous TID WC  . insulin aspart  0-5 Units Subcutaneous QHS  . lactulose  30 g Oral Once  . levETIRAcetam  500 mg Oral BID  . LORazepam  0.5 mg Oral Q8H  . mirtazapine  30 mg Oral QHS  . multivitamin with minerals  1 tablet Oral Daily  . phenytoin  200 mg Oral BID  . polyethylene glycol  17 g Oral BID  . predniSONE  50 mg Oral Q breakfast  . senna-docusate  2 tablet Oral QHS  . sertraline  100 mg Oral BID  . sodium chloride flush  3 mL Intravenous Q12H  . zinc sulfate  220 mg Oral Daily   Continuous Infusions: . sodium chloride 50 mL/hr at 08/04/19 1416  . sodium chloride    . cefTRIAXone (ROCEPHIN)  IV     PRN Meds:.sodium chloride, acetaminophen **OR** acetaminophen, albuterol, chlorpheniramine-HYDROcodone, guaiFENesin-dextromethorphan, ondansetron **OR** ondansetron (ZOFRAN) IV, sodium chloride flush, traZODone   Anti-infectives (From admission, onward)   Start     Dose/Rate Route Frequency Ordered Stop   08/05/19 1930  cefTRIAXone (ROCEPHIN) 1 g in sodium chloride 0.9 % 100 mL IVPB     1 g 200 mL/hr over 30 Minutes Intravenous Every 24 hours 08/05/19 1042     08/05/19 1800  azithromycin (ZITHROMAX) tablet 500 mg     500 mg Oral  Once 08/05/19 1042     08/02/19 1930  cefTRIAXone (ROCEPHIN) 2 g in sodium chloride 0.9 % 100 mL IVPB  Status:  Discontinued     2 g 200 mL/hr over 30 Minutes Intravenous Every 24 hours 08/02/19 0911 08/05/19 1042   08/02/19 1000  remdesivir 100 mg in sodium chloride 0.9 % 100 mL IVPB  Status:  Discontinued     100 mg 200 mL/hr over 30 Minutes Intravenous Daily 08/01/19 1625 08/02/19 0909   08/01/19 2000  azithromycin (ZITHROMAX) 500 mg in sodium chloride 0.9 % 250 mL IVPB  Status:  Discontinued     500 mg 250 mL/hr over 60 Minutes Intravenous Every 24 hours 08/01/19 1907 08/05/19 1042    08/01/19 1930  cefTRIAXone (ROCEPHIN) 1 g in sodium chloride 0.9 % 100 mL IVPB  Status:  Discontinued     1 g 200 mL/hr over 30 Minutes Intravenous Every 24 hours 08/01/19 1907 08/02/19 0911   08/01/19 1630  remdesivir 200 mg in sodium chloride 0.9% 250 mL IVPB     200 mg 580 mL/hr over 30 Minutes Intravenous Once 08/01/19 1625 08/01/19 1749       Objective:   Vitals:   08/05/19 0254 08/05/19 0524 08/05/19 0757 08/05/19 1251  BP: 99/74 90/62  99/67  Pulse: 88 86  83  Resp: 18 18  18   Temp:  97.8 F (36.6 C)    TempSrc:  Oral    SpO2: 98% 98% 99% 97%    Wt Readings from Last 3 Encounters:  07/05/15 75.8 kg  06/29/14 74.8 kg  06/01/14 74.8 kg    Intake/Output Summary (Last 24 hours) at 08/05/2019 1637 Last data filed at 08/05/2019 0400 Gross per 24 hour  Intake 1976.67 ml  Output 2450 ml  Net -473.33 ml   Physical Exam Gen:- Awake Alert, more interactive HEENT:- [place].AT, No sclera icterus Neck-Supple Neck,No JVD,.  Lungs-improving air movement , no wheezing  CV- S1, S2 normal, regular  Abd-  +ve B.Sounds, Abd Soft, No tenderness,    Extremity/Skin:- No  edema, pedal pulses present Psych-baseline cognitive and memory deficits Neuro-generalized weakness, no new focal deficits, no tremors, able to answer questions, talking more, sitting up in a chair-  Data Review:   Micro Results Recent Results (from the past 240 hour(s))  SARS CORONAVIRUS 2 (TAT 6-24 HRS) Nasopharyngeal Nasopharyngeal Swab     Status: None   Collection Time: 08/01/19  2:03 PM   Specimen: Nasopharyngeal Swab  Result Value Ref Range Status   SARS Coronavirus 2 NEGATIVE NEGATIVE Final    Comment: (NOTE) SARS-CoV-2 target nucleic acids are NOT DETECTED. The SARS-CoV-2 RNA is generally detectable in upper and lower respiratory specimens during the acute phase of infection. Negative results do not preclude SARS-CoV-2 infection, do not rule out co-infections with other pathogens, and should not be used as  the sole basis for treatment or other patient management decisions. Negative results must be combined with clinical observations, patient history, and epidemiological information. The expected result is Negative. Fact Sheet for Patients: HairSlick.no Fact Sheet for Healthcare Providers: quierodirigir.com This test is not yet approved or cleared by the [place] FDA and  has been authorized for detection and/or diagnosis of SARS-CoV-2 by FDA under an Emergency Use Authorization (EUA). This EUA will remain  in effect (meaning this test can be used) for the duration of the COVID-19 declaration under Section 56 4(b)(1) of the Act, 21 U.S.C. section 360bbb-3(b)(1), unless the authorization is terminated or revoked sooner. Performed at [facility], [address], [place], [place] [address]   SARS CORONAVIRUS 2 (TAT 6-24 HRS) Nasopharyngeal Nasopharyngeal Swab     Status: None   Collection Time: 08/02/19 11:07 AM   Specimen: Nasopharyngeal Swab  Result Value Ref Range Status   SARS Coronavirus 2 NEGATIVE NEGATIVE Final    Comment: (NOTE) SARS-CoV-2 target nucleic acids are NOT DETECTED. The SARS-CoV-2 RNA is generally detectable in upper and lower respiratory specimens during the acute phase of infection. Negative results do not preclude SARS-CoV-2 infection, do not rule out co-infections with other pathogens, and should not be used as the sole basis for treatment or other patient management decisions. Negative results must be combined with clinical observations, patient history, and epidemiological information. The expected result is Negative. Fact Sheet for Patients: HairSlick.no Fact Sheet for Healthcare Providers: quierodirigir.com This test is not yet approved or cleared by the [place] FDA and  has been authorized for detection and/or diagnosis of SARS-CoV-2  by FDA under an Emergency Use Authorization (EUA). This EUA will remain  in effect (meaning this test can be used) for the duration of the COVID-19 declaration under Section 56 4(b)(1) of the Act, 21 U.S.C. section 360bbb-3(b)(1), unless the authorization is terminated or revoked sooner. Performed at [facility], [address] [place], [place] [address]     Radiology Reports CT Head Wo Contrast  Result Date: 08/01/2019  CLINICAL DATA:  Unresponsiveness.  History of seizure EXAM: CT HEAD WITHOUT CONTRAST TECHNIQUE: Contiguous axial images were obtained from the base of the skull through the vertex without intravenous contrast. COMPARISON:  06/15/2019 FINDINGS: Brain: Chronic encephalomalacia in the left frontotemporal lobe and right cerebellum, unchanged. Stable ventricular size and configuration. No evidence of an acute infarction, intracranial hemorrhage, mass lesion, or mass effect. Scattered low-density changes within the periventricular and subcortical white matter compatible with chronic microvascular ischemic change. Mild diffuse cerebral volume loss. Vascular: Mild atherosclerotic calcifications involving the large vessels of the skull base. No unexpected hyperdense vessel. Skull: Normal. Negative for fracture or focal lesion. Sinuses/Orbits: No acute finding. Other: Chronic debris-filled left external auditory canal. IMPRESSION: 1. No acute intracranial abnormality. 2. 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 Chronic atrophy and small vessel ischemic changes, stable in appearance from prior. Electronically Signed   By: Duanne Guess D.O.   On: 08/01/2019 13:22   DG CHEST PORT 1 VIEW  Result Date: 08/01/2019 CLINICAL DATA:  Acute metabolic encephalopathy. Unresponsiveness. Reportedly diagnosed with pneumonia on 07/30/2019. Pending COVID-19 test. EXAM: PORTABLE CHEST 1 VIEW COMPARISON:  06/15/2019 FINDINGS: Cardiac silhouette normal in size.   No mediastinal or hilar masses. Airspace opacities are noted in both lungs, more subtly on the left and more confluent in the right mid lung. No evidence of pulmonary edema. No pleural effusion or pneumothorax. Skeletal structures are grossly intact. IMPRESSION: 1. Bilateral airspace lung opacities, most confluent in the right mid lung. Suspect multifocal pneumonia. Pattern is consistent with COVID-19 infection. Electronically Signed   By: Amie Portland M.D.   On: 08/01/2019 14:51     CBC Recent Labs  Lab 08/01/19 1220 08/02/19 0743  WBC 4.4 5.2  HGB 13.2 12.4*  HCT 42.7 39.4  PLT 99* 91*  MCV 97.0 95.4  MCH 30.0 30.0  MCHC 30.9 31.5  RDW 13.3 13.3  LYMPHSABS 1.6 1.1  MONOABS 0.6 0.3  EOSABS 0.0 0.0  BASOSABS 0.0 0.0    Chemistries  Recent Labs  Lab 08/02/19 0743 08/02/19 1916 08/03/19 0849 08/04/19 0642 08/05/19 0501  NA 150* 144 148* 148* 145  K 4.2 4.0 4.7 3.9 3.4*  CL 115* 115* 114* 112* 108  CO2 25 25 20* 27 27  GLUCOSE 157* 126* 143* 108* 137*  BUN 27* 26* 25* 21 17  CREATININE 1.24 1.20 1.21 1.18 1.08  CALCIUM 7.9* 7.4* 8.1* 8.0* 8.1*  AST 33  --   --   --   --   ALT 19  --   --   --   --   ALKPHOS 89  --   --   --   --   BILITOT 0.5  --   --   --   --    ------------------------------------------------------------------------------------------------------------------ No results for input(s): CHOL, HDL, LDLCALC, TRIG, CHOLHDL, LDLDIRECT in the last 72 hours.  Lab Results  Component Value Date   HGBA1C 7.4 (H) 08/01/2019   ------------------------------------------------------------------------------------------------------------------ No results for input(s): TSH, T4TOTAL, T3FREE, THYROIDAB in the last 72 hours.  Invalid input(s): FREET3 ------------------------------------------------------------------------------------------------------------------ No results for input(s): VITAMINB12, FOLATE, FERRITIN, TIBC, IRON, RETICCTPCT in the last 72 hours.   Coagulation profile No results for input(s): INR, PROTIME in the last 168 hours.  No results for input(s): DDIMER in the last 72 hours.  Cardiac Enzymes No results for input(s): CKMB, TROPONINI, MYOGLOBIN in the last 168 hours.  Invalid input(s): CK ------------------------------------------------------------------------------------------------------------------ No results found for: BNP   Raun Routh  Laruth Hanger M.D on 08/05/2019 at 4:37 PM  Go to [email or web address] - for contact info  [facility] - Office  [phone]

## 2019-08-06 DIAGNOSIS — R569 Unspecified convulsions: Secondary | ICD-10-CM

## 2019-08-06 DIAGNOSIS — G9341 Metabolic encephalopathy: Secondary | ICD-10-CM

## 2019-08-06 DIAGNOSIS — S069X9D Unspecified intracranial injury with loss of consciousness of unspecified duration, subsequent encounter: Secondary | ICD-10-CM

## 2019-08-06 DIAGNOSIS — N179 Acute kidney failure, unspecified: Secondary | ICD-10-CM

## 2019-08-06 DIAGNOSIS — E860 Dehydration: Secondary | ICD-10-CM

## 2019-08-06 LAB — BASIC METABOLIC PANEL
Anion gap: 11 (ref 5–15)
BUN: 12 mg/dL (ref 8–23)
CO2: 26 mmol/L (ref 22–32)
Calcium: 8 mg/dL — ABNORMAL LOW (ref 8.9–10.3)
Chloride: 109 mmol/L (ref 98–111)
Creatinine, Ser: 1.18 mg/dL (ref 0.61–1.24)
GFR calc Af Amer: >60 mL/min (ref >60)
GFR calc non Af Amer: >60 mL/min (ref >60)
Glucose, Bld: 136 mg/dL — ABNORMAL HIGH (ref 70–99)
Potassium: 3.8 mmol/L (ref 3.5–5.1)
Sodium: 146 mmol/L — ABNORMAL HIGH (ref 135–145)

## 2019-08-06 LAB — GLUCOSE, CAPILLARY
Glucose-Capillary: 133 mg/dL — ABNORMAL HIGH (ref 70–99)
Glucose-Capillary: 110 mg/dL — ABNORMAL HIGH (ref 70–99)

## 2019-08-06 MED ORDER — AMOXICILLIN-POT CLAVULANATE 875-125 MG PO TABS: 1.0000 | ORAL_TABLET | Freq: Two times a day (BID) | ORAL | 0 refills | Status: AC

## 2019-08-06 MED ORDER — BUPROPION HCL ER (XL) 150 MG PO TB24: 150.0000 mg | ORAL_TABLET | Freq: Every day | ORAL | 0 refills | Status: DC

## 2019-08-06 MED ORDER — PREDNISONE 20 MG PO TABS: ORAL_TABLET | ORAL | 0 refills | Status: DC

## 2019-08-06 MED ORDER — SERTRALINE HCL 50 MG PO TABS: 100.0000 mg | ORAL_TABLET | Freq: Two times a day (BID) | ORAL | Status: DC

## 2019-08-06 MED ORDER — GUAIFENESIN-DM 100-10 MG/5ML PO SYRP: 10.0000 mL | ORAL_SOLUTION | ORAL | 0 refills | Status: DC | PRN

## 2019-08-06 NOTE — Discharge Summary (Signed)
Physician Discharge Summary  [name] DJM:[id number] DOB: [birth year] DOA: 08/01/2019  PCP: Butch Penny, MD (Inactive)  Admit date: 08/01/2019 Discharge date: 08/06/2019  Time spent: 35 minutes  Recommendations for Outpatient Follow-up:  1. Repeat basic metabolic panel to follow electrolytes and renal function 2. Repeat chest x-ray in 6 weeks to assure complete resolution of infiltrate   Discharge Diagnoses:  Principal Problem:   Pneumonia-community-acquired/Covid Test NEGATIVE  x 2 Active Problems:   Dehydration   Acute metabolic encephalopathy   Hypernatremia   TBI (traumatic brain injury) -1996    Seizure (HCC)   Dementia without behavioral disturbance (HCC)   AKI (acute kidney injury) (HCC)   Discharge Condition: Stable and improved.  Patient discharged back to skilled nursing facility for further care and rehabilitation.  Diet recommendation: Heart healthy/modified carbohydrate diet.  History of present illness:  As per H&P written by Dr. Mariea Clonts on 08/01/2019. 69 y.o. male resident of SNF facility with history of TBI in 1996, dementia, seizure disorder, HTN, HLD and DM who is usually nonverbal at baseline and usually gets around with a wheelchair presents to the ED with concerns about altered mentation and soft BP -As per EMS EMS called out for unresponsiveness. Arousal to pain only. CBG 359, BP 92/60, ST 100. O2@2l /m n/c. Diagnosed with PNA on 07/30/2019  -Repeat chest x-ray today in the ED suggest possible Covid pneumonia -Covid test is pending patient will be placed as a PUI possible Covid - -History is very limited as patient is nonverbal -Here in the ED temp is 99.2 O2 sats 91% on room air blood pressure initially 96/74 with IV fluids blood pressure up to 123/69, -WBC is 4.4 hemoglobin 13.2 with a platelet count of 99 -Dilantin level is 5.1 unable to correct for albumin is albumin is not available -Depakote level is 53 -Chemistry-sodium of 156, chloride  is 118, glucose is 231, bicarb is 29 anion gap is 9 creatinine is 2.0 no recent baseline available -Inflammatory markers reveal fibrinogen of 478 which is borderline high, however ferritin is 360 which is borderline high -D-dimer is 1.29, calcitonin 0.15, LDH is borderline high at [facility] Course:  1-Multifocal pneumonia bacterial/reactive airway disease -COVID-19 test negative x2 -No hypoxia -Clinically improving, no having productive cough at time of discharge. -Patient will continue as needed bronchodilators and mucolytic's/antitussives. -At discharge will transition his antibiotics to Augmentin orally to complete treatment. -Steroids tapering has been prescribed.  2-dehydration/Hyponatremia -Sodium up to 156 -After fluid resuscitation his sodium level down to 145 -Advised for adequate hydration and free water intake.  3-history of [phone] and dementia -Is stable and without acute hallucinations or agitation -Continue Zoloft twice daily, Remeron and Depakote.  4-history of seizure disorder -No seizure activity appreciated during hospitalization -Continue the use of phenytoin and Keppra.  5-type 2 diabetes mellitus -A1c 7.4 -Resume home oral hypoglycemic agents -Follow modified carbohydrate diet.  6-gastroesophageal for disease -Resume the use of famotidine  7-essential hypertension -Continue the use of amlodipine.  8-acute kidney injury Creatinine 1.24 on presentation After fluid resuscitation and minimization of nephrotoxic agents patient's creatinine 1.08 at time of discharge. -Recommending repeat basic metabolic panel follow-up visit to reassess renal function and stability and Cr trend.  Procedures:  See below for x-ray reports  Consultations:  None   Discharge Exam: Vitals:   08/05/19 2117 08/06/19 0522  BP: 105/77 105/64  Pulse: 89 89  Resp: 17 16  Temp: 97.9 F (36.6 C) 97.8 F (36.6 C)  SpO2: 100% 96%  General: Afebrile, reports no  nausea, no vomiting, no abdominal pain.  Good oxygen saturation on room air.  Generally weak but no new focal deficits appreciated.  Able to answer questions appropriately and intermittently cooperative with care. Cardiovascular: S1 and S2, no rubs, no gallops, no JVD. Respiratory: Positive rhonchi, no crackles, improved air movement bilaterally.  No requiring oxygen supplementation.  No wheezing. Abdomen: Soft, nontender, distended, positive bowel sounds Extremities: No cyanosis, no clubbing.  No edema appreciated on exam.  Discharge Instructions    Allergies as of 08/06/2019   No Known Allergies     Medication List    TAKE these medications   acetaminophen 650 MG CR tablet Commonly known as: TYLENOL Take 650 mg by mouth every 4 (four) hours as needed. pain   amLODipine 10 MG tablet Commonly known as: NORVASC Take 10 mg by mouth daily.   amoxicillin-clavulanate 875-125 MG tablet Commonly known as: Augmentin Take 1 tablet by mouth 2 (two) times daily for 10 doses.   aspirin 81 MG tablet Take 81 mg by mouth daily.   buPROPion 150 MG 24 hr tablet Commonly known as: WELLBUTRIN XL Take 1 tablet (150 mg total) by mouth daily. Start taking on: August 07, 2019 What changed:   medication strength  how much to take   Cholecalciferol 50 MCG (2000 UT) Tabs Take 1 tablet by mouth daily.   divalproex 500 MG DR tablet Commonly known as: DEPAKOTE Take 1 tablet by mouth 3 (three) times daily.   famotidine 10 MG tablet Commonly known as: PEPCID Take 20 mg by mouth daily.   glipiZIDE 5 MG 24 hr tablet Commonly known as: GLUCOTROL XL Take 5 mg by mouth daily.   guaiFENesin-dextromethorphan 100-10 MG/5ML syrup Commonly known as: ROBITUSSIN DM Take 10 mLs by mouth every 4 (four) hours as needed for cough.   levETIRAcetam 500 MG tablet Commonly known as: KEPPRA Take 1 tablet (500 mg total) by mouth 2 (two) times daily.   LORazepam 0.5 MG tablet Commonly known as:  ATIVAN Take 0.5 mg by mouth every 8 (eight) hours. For epilepsy given at 0600,1400,2200   magnesium hydroxide 400 MG/5ML suspension Commonly known as: MILK OF MAGNESIA Take 30 mLs by mouth daily as needed. constipation   megestrol 625 MG/5ML suspension Commonly known as: MEGACE ES Take 625 mg by mouth daily. Strength not listed on nursing home MAR. He is taking 15cc daily.   metFORMIN 500 MG tablet Commonly known as: GLUCOPHAGE Take 500 mg by mouth 2 (two) times daily with a meal.   mirtazapine 30 MG tablet Commonly known as: REMERON Take 30 mg by mouth at bedtime.   multivitamin capsule Take 1 capsule by mouth daily.   phenytoin 50 MG tablet Commonly known as: DILANTIN Chew 1 tablet by mouth 3 (three) times daily.   polyethylene glycol 17 g packet Commonly known as: MIRALAX / GLYCOLAX Take 17 g by mouth daily.   predniSONE 20 MG tablet Commonly known as: DELTASONE 3 tablets daily x1 day; then 2 tablets daily x2 days; then 1 tablet daily x2 days; then half tablet daily x3 days and stop prednisone.   rosuvastatin 10 MG tablet Commonly known as: CRESTOR Take 10 mg by mouth at bedtime.   senna-docusate 8.6-50 MG tablet Commonly known as: Senokot-S Take 1 tablet by mouth daily.   sertraline 50 MG tablet Commonly known as: ZOLOFT Take 2 tablets (100 mg total) by mouth 2 (two) times daily. What changed: Another medication with the same name was  removed. Continue taking this medication, and follow the directions you see here.      No Known Allergies Follow-up Information    [name], MD. Schedule an appointment as soon as possible for a visit in 10 day(s).   Specialty: Family Medicine Contact information: [address] [place] [address] [phone]           The results of significant diagnostics from this hospitalization (including imaging, microbiology, ancillary and laboratory) are listed below for reference.    Significant Diagnostic  Studies: CT Head Wo Contrast  Result Date: 08/01/2019 CLINICAL DATA:  Unresponsiveness.  History of seizure EXAM: CT HEAD WITHOUT CONTRAST TECHNIQUE: Contiguous axial images were obtained from the base of the skull through the vertex without intravenous contrast. COMPARISON:  06/15/2019 FINDINGS: Brain: Chronic encephalomalacia in the left frontotemporal lobe and right cerebellum, unchanged. Stable ventricular size and configuration. No evidence of an acute infarction, intracranial hemorrhage, mass lesion, or mass effect. Scattered low-density changes within the periventricular and subcortical white matter compatible with chronic microvascular ischemic change. Mild diffuse cerebral volume loss. Vascular: Mild atherosclerotic calcifications involving the large vessels of the skull base. No unexpected hyperdense vessel. Skull: Normal. Negative for fracture or focal lesion. Sinuses/Orbits: No acute finding. Other: Chronic debris-filled left external auditory canal. IMPRESSION: 1. No acute intracranial abnormality. 2. 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 Chronic atrophy and small vessel ischemic changes, stable in appearance from prior. Electronically Signed   By: Davina Poke D.O.   On: 08/01/2019 13:22   DG CHEST PORT 1 VIEW  Result Date: 08/01/2019 CLINICAL DATA:  Acute metabolic encephalopathy. Unresponsiveness. Reportedly diagnosed with pneumonia on 07/30/2019. Pending COVID-19 test. EXAM: PORTABLE CHEST 1 VIEW COMPARISON:  06/15/2019 FINDINGS: Cardiac silhouette normal in size.  No mediastinal or hilar masses. Airspace opacities are noted in both lungs, more subtly on the left and more confluent in the right mid lung. No evidence of pulmonary edema. No pleural effusion or pneumothorax. Skeletal structures are grossly intact. IMPRESSION: 1. Bilateral airspace lung opacities, most confluent in the right mid lung. Suspect multifocal pneumonia. Pattern is consistent  with COVID-19 infection. Electronically Signed   By: Lajean Manes M.D.   On: 08/01/2019 14:51    Microbiology: Recent Results (from the past 240 hour(s))  SARS CORONAVIRUS 2 (TAT 6-24 HRS) Nasopharyngeal Nasopharyngeal Swab     Status: None   Collection Time: 08/01/19  2:03 PM   Specimen: Nasopharyngeal Swab  Result Value Ref Range Status   SARS Coronavirus 2 NEGATIVE NEGATIVE Final    Comment: (NOTE) SARS-CoV-2 target nucleic acids are NOT DETECTED. The SARS-CoV-2 RNA is generally detectable in upper and lower respiratory specimens during the acute phase of infection. Negative results do not preclude SARS-CoV-2 infection, do not rule out co-infections with other pathogens, and should not be used as the sole basis for treatment or other patient management decisions. Negative results must be combined with clinical observations, patient history, and epidemiological information. The expected result is Negative. Fact Sheet for Patients: SugarRoll.be Fact Sheet for Healthcare Providers: https://www.woods-mathews.com/ This test is not yet approved or cleared by the [place] FDA and  has been authorized for detection and/or diagnosis of SARS-CoV-2 by FDA under an Emergency Use Authorization (EUA). This EUA will remain  in effect (meaning this test can be used) for the duration of the COVID-19 declaration under Section 56 4(b)(1) of the Act, 21 U.S.C. section 360bbb-3(b)(1), unless the authorization is terminated or revoked sooner. Performed at  [facility], [address], [place], [place] [address]   SARS CORONAVIRUS 2 (TAT 6-24 HRS) Nasopharyngeal Nasopharyngeal Swab     Status: None   Collection Time: 08/02/19 11:07 AM   Specimen: Nasopharyngeal Swab  Result Value Ref Range Status   SARS Coronavirus 2 NEGATIVE NEGATIVE Final    Comment: (NOTE) SARS-CoV-2 target nucleic acids are NOT DETECTED. The SARS-CoV-2 RNA is generally  detectable in upper and lower respiratory specimens during the acute phase of infection. Negative results do not preclude SARS-CoV-2 infection, do not rule out co-infections with other pathogens, and should not be used as the sole basis for treatment or other patient management decisions. Negative results must be combined with clinical observations, patient history, and epidemiological information. The expected result is Negative. Fact Sheet for Patients: HairSlick.no Fact Sheet for Healthcare Providers: quierodirigir.com This test is not yet approved or cleared by the [place] FDA and  has been authorized for detection and/or diagnosis of SARS-CoV-2 by FDA under an Emergency Use Authorization (EUA). This EUA will remain  in effect (meaning this test can be used) for the duration of the COVID-19 declaration under Section 56 4(b)(1) of the Act, 21 U.S.C. section 360bbb-3(b)(1), unless the authorization is terminated or revoked sooner. Performed at [facility], [address], [place], [place] [address]      Labs: Basic Metabolic Panel: Recent Labs  Lab 08/01/19 2314 08/02/19 0743 08/02/19 1916 08/03/19 0849 08/04/19 0642 08/05/19 0501 08/06/19 0604  NA 152*   < > 144 148* 148* 145 146*  K 3.8   < > 4.0 4.7 3.9 3.4* 3.8  CL 118*   < > 115* 114* 112* 108 109  CO2 26   < > 25 20* 27 27 26   GLUCOSE 150*   < > 126* 143* 108* 137* 136*  BUN 30*   < > 26* 25* 21 17 12   CREATININE 1.44*   < > 1.20 1.21 1.18 1.08 1.18  CALCIUM 7.2*   < > 7.4* 8.1* 8.0* 8.1* 8.0*  PHOS 2.6  --   --  2.7  --   --   --    < > = values in this interval not displayed.   Liver Function Tests: Recent Labs  Lab 08/01/19 2314 08/02/19 0743 08/03/19 0849  AST  --  33  --   ALT  --  19  --   ALKPHOS  --  89  --   BILITOT  --  0.5  --   PROT  --  5.9*  --   ALBUMIN 2.6* 2.6* 2.9*   CBC: Recent Labs  Lab 08/01/19 1220  08/02/19 0743  WBC 4.4 5.2  NEUTROABS 2.2 3.7  HGB 13.2 12.4*  HCT 42.7 39.4  MCV 97.0 95.4  PLT 99* 91*    CBG: Recent Labs  Lab 08/05/19 1117 08/05/19 1649 08/05/19 2118 08/06/19 0735 08/06/19 1116  GLUCAP 140* 169* 182* 133* 110*    Signed:  08/08/19 MD.  [facility] 08/06/2019, 1:40 PM

## 2019-08-06 NOTE — TOC Transition Note (Signed)
Transition of Care [facility]) - CM/SW Discharge Note   Patient Details  Name: [name] MRN: [id number] Date of Birth: 03/09/1951  [facility] CM/SW Contact:  Elliot Gault, LCSW Phone Number: 08/06/2019, 2:11 PM   Clinical Narrative:     [name] has bed for pt to return today. Updated MD and RN. RN will call report. EMS form printed to the floor. Will call EMS when pt ready. Attempted to call pt's sister to update but she did not answer and unable to leave a message.   There are no other TOC needs for dc.  Final next level of care: Long Term Nursing Home Barriers to Discharge: Barriers Resolved   Patient Goals and CMS Choice        Discharge Placement                       Discharge Plan and Services                                     Social Determinants of Health (SDOH) Interventions     Readmission Risk Interventions No flowsheet data found.

## 2019-08-06 NOTE — Progress Notes (Signed)
Patient swallowed less than half of pills in cup, then closed his eyes and would not open them or attempt to swallow the rest. RN attempted to rouse patient, and encourage him to take the rest but patient refuses to open his eyes or mouth. Notified Dr. Gwenlyn Perking and will attempt again to get patient to take them after letting him rest.

## 2023-02-03 ENCOUNTER — Inpatient Hospital Stay (HOSPITAL_COMMUNITY): Payer: Medicare (Managed Care)

## 2023-02-03 ENCOUNTER — Emergency Department (HOSPITAL_COMMUNITY): Payer: Medicare (Managed Care)

## 2023-02-03 ENCOUNTER — Inpatient Hospital Stay (HOSPITAL_COMMUNITY)
Admission: EM | Admit: 2023-02-03 | Discharge: 2023-02-15 | DRG: 551 | Disposition: E | Payer: Medicare (Managed Care) | Source: Skilled Nursing Facility | Attending: Pulmonary Disease | Admitting: Pulmonary Disease

## 2023-02-03 DIAGNOSIS — N390 Urinary tract infection, site not specified: Secondary | ICD-10-CM | POA: Diagnosis present

## 2023-02-03 DIAGNOSIS — E876 Hypokalemia: Secondary | ICD-10-CM | POA: Diagnosis not present

## 2023-02-03 DIAGNOSIS — Z515 Encounter for palliative care: Secondary | ICD-10-CM | POA: Diagnosis not present

## 2023-02-03 DIAGNOSIS — Z79899 Other long term (current) drug therapy: Secondary | ICD-10-CM

## 2023-02-03 DIAGNOSIS — R402431 Glasgow coma scale score 3-8, in the field [EMT or ambulance]: Secondary | ICD-10-CM

## 2023-02-03 DIAGNOSIS — E785 Hyperlipidemia, unspecified: Secondary | ICD-10-CM | POA: Diagnosis present

## 2023-02-03 DIAGNOSIS — E872 Acidosis, unspecified: Secondary | ICD-10-CM | POA: Diagnosis present

## 2023-02-03 DIAGNOSIS — R000 Tachycardia, unspecified: Secondary | ICD-10-CM | POA: Diagnosis present

## 2023-02-03 DIAGNOSIS — Z7401 Bed confinement status: Secondary | ICD-10-CM

## 2023-02-03 DIAGNOSIS — S00211A Abrasion of right eyelid and periocular area, initial encounter: Secondary | ICD-10-CM | POA: Diagnosis present

## 2023-02-03 DIAGNOSIS — S069XAS Unspecified intracranial injury with loss of consciousness status unknown, sequela: Secondary | ICD-10-CM

## 2023-02-03 DIAGNOSIS — G40909 Epilepsy, unspecified, not intractable, without status epilepticus: Secondary | ICD-10-CM | POA: Diagnosis present

## 2023-02-03 DIAGNOSIS — Z808 Family history of malignant neoplasm of other organs or systems: Secondary | ICD-10-CM

## 2023-02-03 DIAGNOSIS — Z1152 Encounter for screening for COVID-19: Secondary | ICD-10-CM

## 2023-02-03 DIAGNOSIS — S12490A Other displaced fracture of fifth cervical vertebra, initial encounter for closed fracture: Principal | ICD-10-CM | POA: Diagnosis present

## 2023-02-03 DIAGNOSIS — J9602 Acute respiratory failure with hypercapnia: Secondary | ICD-10-CM | POA: Diagnosis present

## 2023-02-03 DIAGNOSIS — Y92122 Bedroom in nursing home as the place of occurrence of the external cause: Secondary | ICD-10-CM | POA: Diagnosis not present

## 2023-02-03 DIAGNOSIS — J9690 Respiratory failure, unspecified, unspecified whether with hypoxia or hypercapnia: Secondary | ICD-10-CM | POA: Diagnosis present

## 2023-02-03 DIAGNOSIS — Z7982 Long term (current) use of aspirin: Secondary | ICD-10-CM | POA: Diagnosis not present

## 2023-02-03 DIAGNOSIS — T148XXA Other injury of unspecified body region, initial encounter: Secondary | ICD-10-CM | POA: Diagnosis present

## 2023-02-03 DIAGNOSIS — W06XXXA Fall from bed, initial encounter: Secondary | ICD-10-CM | POA: Diagnosis present

## 2023-02-03 DIAGNOSIS — Z22322 Carrier or suspected carrier of Methicillin resistant Staphylococcus aureus: Secondary | ICD-10-CM

## 2023-02-03 DIAGNOSIS — J9601 Acute respiratory failure with hypoxia: Secondary | ICD-10-CM | POA: Diagnosis present

## 2023-02-03 DIAGNOSIS — Z87440 Personal history of urinary (tract) infections: Secondary | ICD-10-CM | POA: Diagnosis not present

## 2023-02-03 DIAGNOSIS — F0284 Dementia in other diseases classified elsewhere, unspecified severity, with anxiety: Secondary | ICD-10-CM | POA: Diagnosis present

## 2023-02-03 DIAGNOSIS — I10 Essential (primary) hypertension: Secondary | ICD-10-CM | POA: Diagnosis present

## 2023-02-03 DIAGNOSIS — Z800 Family history of malignant neoplasm of digestive organs: Secondary | ICD-10-CM

## 2023-02-03 DIAGNOSIS — N179 Acute kidney failure, unspecified: Secondary | ICD-10-CM | POA: Diagnosis present

## 2023-02-03 DIAGNOSIS — S12401A Unspecified nondisplaced fracture of fifth cervical vertebra, initial encounter for closed fracture: Principal | ICD-10-CM

## 2023-02-03 DIAGNOSIS — G9341 Metabolic encephalopathy: Secondary | ICD-10-CM | POA: Diagnosis present

## 2023-02-03 DIAGNOSIS — Z66 Do not resuscitate: Secondary | ICD-10-CM | POA: Diagnosis present

## 2023-02-03 DIAGNOSIS — R0603 Acute respiratory distress: Secondary | ICD-10-CM | POA: Diagnosis present

## 2023-02-03 DIAGNOSIS — J988 Other specified respiratory disorders: Secondary | ICD-10-CM | POA: Diagnosis present

## 2023-02-03 DIAGNOSIS — G40409 Other generalized epilepsy and epileptic syndromes, not intractable, without status epilepticus: Secondary | ICD-10-CM | POA: Diagnosis present

## 2023-02-03 DIAGNOSIS — R569 Unspecified convulsions: Secondary | ICD-10-CM | POA: Diagnosis not present

## 2023-02-03 DIAGNOSIS — S069XAA Unspecified intracranial injury with loss of consciousness status unknown, initial encounter: Secondary | ICD-10-CM | POA: Diagnosis present

## 2023-02-03 DIAGNOSIS — Z7984 Long term (current) use of oral hypoglycemic drugs: Secondary | ICD-10-CM

## 2023-02-03 DIAGNOSIS — D649 Anemia, unspecified: Secondary | ICD-10-CM | POA: Diagnosis present

## 2023-02-03 DIAGNOSIS — R579 Shock, unspecified: Secondary | ICD-10-CM | POA: Diagnosis present

## 2023-02-03 DIAGNOSIS — K219 Gastro-esophageal reflux disease without esophagitis: Secondary | ICD-10-CM | POA: Diagnosis present

## 2023-02-03 DIAGNOSIS — E1165 Type 2 diabetes mellitus with hyperglycemia: Secondary | ICD-10-CM | POA: Diagnosis present

## 2023-02-03 DIAGNOSIS — Z781 Physical restraint status: Secondary | ICD-10-CM

## 2023-02-03 DIAGNOSIS — Z823 Family history of stroke: Secondary | ICD-10-CM

## 2023-02-03 LAB — PROTIME-INR
INR: 1 (ref 0.8–1.2)
Prothrombin Time: 12.9 seconds (ref 11.4–15.2)

## 2023-02-03 LAB — CBC
HCT: 38 % — ABNORMAL LOW (ref 39.0–52.0)
Hemoglobin: 12.4 g/dL — ABNORMAL LOW (ref 13.0–17.0)
MCH: 31.2 pg (ref 26.0–34.0)
MCHC: 32.6 g/dL (ref 30.0–36.0)
MCV: 95.7 fL (ref 80.0–100.0)
Platelets: 297 10*3/uL (ref 150–400)
RBC: 3.97 MIL/uL — ABNORMAL LOW (ref 4.22–5.81)
RDW: 13.5 % (ref 11.5–15.5)
WBC: 18.4 10*3/uL — ABNORMAL HIGH (ref 4.0–10.5)
nRBC: 0 % (ref 0.0–0.2)

## 2023-02-03 LAB — HIV ANTIBODY (ROUTINE TESTING W REFLEX): HIV Screen 4th Generation wRfx: NONREACTIVE

## 2023-02-03 LAB — COMPREHENSIVE METABOLIC PANEL
ALT: 15 U/L (ref 0–44)
AST: 23 U/L (ref 15–41)
Albumin: 3.3 g/dL — ABNORMAL LOW (ref 3.5–5.0)
Alkaline Phosphatase: 99 U/L (ref 38–126)
Anion gap: 9 (ref 5–15)
BUN: 29 mg/dL — ABNORMAL HIGH (ref 8–23)
CO2: 26 mmol/L (ref 22–32)
Calcium: 8.7 mg/dL — ABNORMAL LOW (ref 8.9–10.3)
Chloride: 103 mmol/L (ref 98–111)
Creatinine, Ser: 1.71 mg/dL — ABNORMAL HIGH (ref 0.61–1.24)
GFR, Estimated: 43 mL/min — ABNORMAL LOW (ref >60)
Glucose, Bld: 251 mg/dL — ABNORMAL HIGH (ref 70–99)
Potassium: 4.9 mmol/L (ref 3.5–5.1)
Sodium: 138 mmol/L (ref 135–145)
Total Bilirubin: 0.8 mg/dL (ref 0.3–1.2)
Total Protein: 7 g/dL (ref 6.5–8.1)

## 2023-02-03 LAB — URINALYSIS, W/ REFLEX TO CULTURE (INFECTION SUSPECTED)
Bilirubin Urine: NEGATIVE
Glucose, UA: NEGATIVE mg/dL
Hgb urine dipstick: NEGATIVE
Ketones, ur: NEGATIVE mg/dL
Nitrite: POSITIVE — AB
Protein, ur: 30 mg/dL — AB
Specific Gravity, Urine: 1.009 (ref 1.005–1.030)
pH: 5 (ref 5.0–8.0)

## 2023-02-03 LAB — DIFFERENTIAL
Abs Immature Granulocytes: 0 10*3/uL (ref 0.00–0.07)
Basophils Absolute: 0.2 10*3/uL — ABNORMAL HIGH (ref 0.0–0.1)
Basophils Relative: 1 %
Eosinophils Absolute: 0.9 10*3/uL — ABNORMAL HIGH (ref 0.0–0.5)
Eosinophils Relative: 5 %
Lymphocytes Relative: 45 %
Lymphs Abs: 8.3 10*3/uL — ABNORMAL HIGH (ref 0.7–4.0)
Monocytes Absolute: 0.6 10*3/uL (ref 0.1–1.0)
Monocytes Relative: 3 %
Neutro Abs: 8.5 10*3/uL — ABNORMAL HIGH (ref 1.7–7.7)
Neutrophils Relative %: 46 %

## 2023-02-03 LAB — LACTIC ACID, PLASMA
Lactic Acid, Venous: 2.9 mmol/L (ref 0.5–1.9)
Lactic Acid, Venous: 4.1 mmol/L (ref 0.5–1.9)

## 2023-02-03 LAB — GLUCOSE, CAPILLARY
Glucose-Capillary: 132 mg/dL — ABNORMAL HIGH (ref 70–99)
Glucose-Capillary: 113 mg/dL — ABNORMAL HIGH (ref 70–99)
Glucose-Capillary: 97 mg/dL (ref 70–99)

## 2023-02-03 LAB — CULTURE, BLOOD (ROUTINE X 2): Special Requests: ADEQUATE

## 2023-02-03 LAB — POCT I-STAT 7, (LYTES, BLD GAS, ICA,H+H)
Acid-base deficit: 6 mmol/L — ABNORMAL HIGH (ref 0.0–2.0)
Bicarbonate: 18.2 mmol/L — ABNORMAL LOW (ref 20.0–28.0)
Calcium, Ion: 1.12 mmol/L — ABNORMAL LOW (ref 1.15–1.40)
HCT: 32 % — ABNORMAL LOW (ref 39.0–52.0)
Hemoglobin: 10.9 g/dL — ABNORMAL LOW (ref 13.0–17.0)
O2 Saturation: 99 %
Patient temperature: 97
Potassium: 4.6 mmol/L (ref 3.5–5.1)
Sodium: 138 mmol/L (ref 135–145)
TCO2: 19 mmol/L — ABNORMAL LOW (ref 22–32)
pCO2 arterial: 29.3 mmHg — ABNORMAL LOW (ref 32–48)
pH, Arterial: 7.396 (ref 7.35–7.45)
pO2, Arterial: 123 mmHg — ABNORMAL HIGH (ref 83–108)

## 2023-02-03 LAB — RESP PANEL BY RT-PCR (RSV, FLU A&B, COVID)  RVPGX2
Influenza A by PCR: NEGATIVE
Influenza B by PCR: NEGATIVE
Resp Syncytial Virus by PCR: NEGATIVE
SARS Coronavirus 2 by RT PCR: NEGATIVE

## 2023-02-03 LAB — TROPONIN I (HIGH SENSITIVITY)
Troponin I (High Sensitivity): 16 ng/L (ref <18)
Troponin I (High Sensitivity): 28 ng/L — ABNORMAL HIGH (ref <18)

## 2023-02-03 LAB — VALPROIC ACID LEVEL: Valproic Acid Lvl: 58 ug/mL (ref 50.0–100.0)

## 2023-02-03 LAB — BLOOD GAS, ARTERIAL
Acid-base deficit: 2.6 mmol/L — ABNORMAL HIGH (ref 0.0–2.0)
Bicarbonate: 21.8 mmol/L (ref 20.0–28.0)
Drawn by: 27016
O2 Saturation: 100 %
Patient temperature: 36.5
pCO2 arterial: 35 mmHg (ref 32–48)
pH, Arterial: 7.4 (ref 7.35–7.45)
pO2, Arterial: 167 mmHg — ABNORMAL HIGH (ref 83–108)

## 2023-02-03 LAB — HEMOGLOBIN A1C
Hgb A1c MFr Bld: 5.3 % (ref 4.8–5.6)
Mean Plasma Glucose: 105.41 mg/dL

## 2023-02-03 LAB — APTT: aPTT: 29 seconds (ref 24–36)

## 2023-02-03 LAB — BRAIN NATRIURETIC PEPTIDE: B Natriuretic Peptide: 143 pg/mL — ABNORMAL HIGH (ref 0.0–100.0)

## 2023-02-03 LAB — ETHANOL: Alcohol, Ethyl (B): <10 mg/dL (ref <10)

## 2023-02-03 LAB — MRSA NEXT GEN BY PCR, NASAL: MRSA by PCR Next Gen: DETECTED — AB

## 2023-02-03 MED ORDER — PANTOPRAZOLE SODIUM 40 MG IV SOLR
40.0000 mg | Freq: Every day | INTRAVENOUS | Status: DC
  Administered 2023-02-03 – 2023-02-04 (×2): 40 mg via INTRAVENOUS
  Filled 2023-02-03 (×2): qty 10

## 2023-02-03 MED ORDER — VANCOMYCIN HCL IN DEXTROSE 1-5 GM/200ML-% IV SOLN: 1000.0000 mg | Freq: Once | INTRAVENOUS | Status: DC

## 2023-02-03 MED ORDER — FENTANYL CITRATE PF 50 MCG/ML IJ SOSY: 25.0000 ug | PREFILLED_SYRINGE | Freq: Once | INTRAMUSCULAR | Status: AC

## 2023-02-03 MED ORDER — SODIUM CHLORIDE 0.9 % IV BOLUS
1000.0000 mL | Freq: Once | INTRAVENOUS | Status: AC
  Administered 2023-02-03: 1000 mL via INTRAVENOUS

## 2023-02-03 MED ORDER — DOCUSATE SODIUM 50 MG/5ML PO LIQD
100.0000 mg | Freq: Two times a day (BID) | ORAL | Status: DC
  Administered 2023-02-03 – 2023-02-05 (×4): 100 mg
  Filled 2023-02-03 (×4): qty 10

## 2023-02-03 MED ORDER — PROPOFOL 1000 MG/100ML IV EMUL
0.0000 ug/kg/min | INTRAVENOUS | Status: DC
  Administered 2023-02-03: 20 ug/kg/min via INTRAVENOUS
  Administered 2023-02-04 (×2): 10 ug/kg/min via INTRAVENOUS
  Filled 2023-02-03 (×3): qty 100

## 2023-02-03 MED ORDER — ROCURONIUM BROMIDE 10 MG/ML (PF) SYRINGE
PREFILLED_SYRINGE | INTRAVENOUS | Status: AC
  Administered 2023-02-03: 100 mg
  Filled 2023-02-03: qty 10

## 2023-02-03 MED ORDER — MIDAZOLAM HCL 2 MG/2ML IJ SOLN: 1.0000 mg | INTRAMUSCULAR | Status: DC | PRN

## 2023-02-03 MED ORDER — VALPROATE SODIUM 100 MG/ML IV SOLN
500.0000 mg | Freq: Three times a day (TID) | INTRAVENOUS | Status: DC
  Administered 2023-02-04 – 2023-02-05 (×5): 500 mg via INTRAVENOUS
  Filled 2023-02-03 (×6): qty 5

## 2023-02-03 MED ORDER — VANCOMYCIN HCL IN DEXTROSE 1-5 GM/200ML-% IV SOLN: 1000.0000 mg | INTRAVENOUS | Status: DC

## 2023-02-03 MED ORDER — FENTANYL 2500MCG IN NS 250ML (10MCG/ML) PREMIX INFUSION: 25.0000 ug/h | INTRAVENOUS | Status: DC

## 2023-02-03 MED ORDER — NOREPINEPHRINE 4 MG/250ML-% IV SOLN
INTRAVENOUS | Status: AC
  Administered 2023-02-03: 4 mg via INTRAVENOUS
  Filled 2023-02-03: qty 250

## 2023-02-03 MED ORDER — SODIUM CHLORIDE 0.9 % IV SOLN
250.0000 mL | INTRAVENOUS | Status: DC
  Administered 2023-02-03: 250 mL via INTRAVENOUS

## 2023-02-03 MED ORDER — ETOMIDATE 2 MG/ML IV SOLN
INTRAVENOUS | Status: AC
  Administered 2023-02-03: 20 mg
  Filled 2023-02-03: qty 10

## 2023-02-03 MED ORDER — LEVETIRACETAM 500 MG PO TABS
500.0000 mg | ORAL_TABLET | Freq: Two times a day (BID) | ORAL | Status: DC
  Administered 2023-02-03 – 2023-02-05 (×4): 500 mg
  Filled 2023-02-03 (×4): qty 1

## 2023-02-03 MED ORDER — SODIUM CHLORIDE 0.9 % IV SOLN: 2.0000 g | Freq: Once | INTRAVENOUS | Status: AC

## 2023-02-03 MED ORDER — LORAZEPAM 2 MG/ML IJ SOLN
1.0000 mg | Freq: Three times a day (TID) | INTRAMUSCULAR | Status: DC
  Administered 2023-02-03 – 2023-02-04 (×2): 1 mg via INTRAVENOUS
  Filled 2023-02-03 (×2): qty 1

## 2023-02-03 MED ORDER — FENTANYL CITRATE PF 50 MCG/ML IJ SOSY: 25.0000 ug | PREFILLED_SYRINGE | Freq: Once | INTRAMUSCULAR | Status: DC

## 2023-02-03 MED ORDER — DOCUSATE SODIUM 100 MG PO CAPS: 100.0000 mg | ORAL_CAPSULE | Freq: Two times a day (BID) | ORAL | Status: DC | PRN

## 2023-02-03 MED ORDER — FENTANYL 2500MCG IN NS 250ML (10MCG/ML) PREMIX INFUSION
INTRAVENOUS | Status: AC
  Administered 2023-02-03: 25 ug/h via INTRAVENOUS
  Filled 2023-02-03: qty 250

## 2023-02-03 MED ORDER — SODIUM CHLORIDE 0.9 % IV SOLN
INTRAVENOUS | Status: AC
  Administered 2023-02-03: 2 g via INTRAVENOUS
  Filled 2023-02-03: qty 12.5

## 2023-02-03 MED ORDER — MUPIROCIN 2 % EX OINT
1.0000 | TOPICAL_OINTMENT | Freq: Two times a day (BID) | CUTANEOUS | Status: DC
  Administered 2023-02-04 – 2023-02-05 (×4): 1 via NASAL
  Filled 2023-02-03 (×2): qty 22

## 2023-02-03 MED ORDER — NOREPINEPHRINE 4 MG/250ML-% IV SOLN
2.0000 ug/min | INTRAVENOUS | Status: DC
  Administered 2023-02-03 – 2023-02-04 (×4): 10 ug/min via INTRAVENOUS
  Filled 2023-02-03 (×3): qty 250

## 2023-02-03 MED ORDER — INSULIN ASPART 100 UNIT/ML IJ SOLN
0.0000 [IU] | INTRAMUSCULAR | Status: DC
  Administered 2023-02-03 – 2023-02-04 (×4): 2 [IU] via SUBCUTANEOUS
  Administered 2023-02-04: 3 [IU] via SUBCUTANEOUS
  Administered 2023-02-04 – 2023-02-05 (×2): 2 [IU] via SUBCUTANEOUS

## 2023-02-03 MED ORDER — PIPERACILLIN-TAZOBACTAM 3.375 G IVPB
3.3750 g | Freq: Three times a day (TID) | INTRAVENOUS | Status: DC
  Administered 2023-02-03 – 2023-02-04 (×2): 3.375 g via INTRAVENOUS
  Filled 2023-02-03 (×2): qty 50

## 2023-02-03 MED ORDER — GADOBUTROL 1 MMOL/ML IV SOLN
7.5000 mL | Freq: Once | INTRAVENOUS | Status: AC | PRN
  Administered 2023-02-03: 7.5 mL via INTRAVENOUS

## 2023-02-03 MED ORDER — FENTANYL BOLUS VIA INFUSION: 25.0000 ug | INTRAVENOUS | Status: DC | PRN

## 2023-02-03 MED ORDER — FENTANYL CITRATE PF 50 MCG/ML IJ SOSY
PREFILLED_SYRINGE | INTRAMUSCULAR | Status: AC
  Administered 2023-02-03: 25 ug via INTRAVENOUS
  Filled 2023-02-03: qty 1

## 2023-02-03 MED ORDER — ORAL CARE MOUTH RINSE
15.0000 mL | OROMUCOSAL | Status: DC
  Administered 2023-02-03 – 2023-02-05 (×22): 15 mL via OROMUCOSAL

## 2023-02-03 MED ORDER — NOREPINEPHRINE 4 MG/250ML-% IV SOLN
0.0000 ug/min | INTRAVENOUS | Status: DC
  Filled 2023-02-03: qty 250

## 2023-02-03 MED ORDER — LACTATED RINGERS IV BOLUS
1000.0000 mL | Freq: Once | INTRAVENOUS | Status: AC
  Administered 2023-02-03: 1000 mL via INTRAVENOUS

## 2023-02-03 MED ORDER — METRONIDAZOLE 500 MG/100ML IV SOLN
500.0000 mg | Freq: Once | INTRAVENOUS | Status: AC
  Administered 2023-02-03: 500 mg via INTRAVENOUS
  Filled 2023-02-03: qty 100

## 2023-02-03 MED ORDER — PHENYTOIN 50 MG PO CHEW
50.0000 mg | CHEWABLE_TABLET | Freq: Three times a day (TID) | ORAL | Status: DC
  Administered 2023-02-03 – 2023-02-04 (×2): 50 mg
  Filled 2023-02-03 (×4): qty 1

## 2023-02-03 MED ORDER — SODIUM CHLORIDE 0.9 % IV SOLN: 2.0000 g | Freq: Two times a day (BID) | INTRAVENOUS | Status: DC

## 2023-02-03 MED ORDER — MIDAZOLAM-SODIUM CHLORIDE 100-0.9 MG/100ML-% IV SOLN
0.5000 mg/h | INTRAVENOUS | Status: DC
  Administered 2023-02-03: 0.5 mg/h via INTRAVENOUS

## 2023-02-03 MED ORDER — FENTANYL BOLUS VIA INFUSION
25.0000 ug | INTRAVENOUS | Status: DC | PRN
  Administered 2023-02-04: 25 ug via INTRAVENOUS
  Administered 2023-02-05 (×4): 50 ug via INTRAVENOUS

## 2023-02-03 MED ORDER — POLYETHYLENE GLYCOL 3350 17 G PO PACK: 17.0000 g | PACK | Freq: Every day | ORAL | Status: DC | PRN

## 2023-02-03 MED ORDER — FENTANYL 2500MCG IN NS 250ML (10MCG/ML) PREMIX INFUSION
25.0000 ug/h | INTRAVENOUS | Status: DC
  Administered 2023-02-03: 100 ug/h via INTRAVENOUS
  Administered 2023-02-03: 25 ug/h via INTRAVENOUS
  Filled 2023-02-03 (×2): qty 250

## 2023-02-03 MED ORDER — POLYETHYLENE GLYCOL 3350 17 G PO PACK
17.0000 g | PACK | Freq: Every day | ORAL | Status: DC
  Administered 2023-02-04 – 2023-02-05 (×2): 17 g
  Filled 2023-02-03 (×2): qty 1

## 2023-02-03 MED ORDER — SODIUM CHLORIDE 0.9 % IV SOLN
INTRAVENOUS | Status: DC
  Administered 2023-02-03: 125 mL/h via INTRAVENOUS

## 2023-02-03 MED ORDER — CHLORHEXIDINE GLUCONATE CLOTH 2 % EX PADS
6.0000 | MEDICATED_PAD | Freq: Every day | CUTANEOUS | Status: DC
  Administered 2023-02-03 – 2023-02-05 (×2): 6 via TOPICAL

## 2023-02-03 MED ORDER — LACTATED RINGERS IV SOLN: INTRAVENOUS | Status: AC

## 2023-02-03 MED ORDER — ORAL CARE MOUTH RINSE: 15.0000 mL | OROMUCOSAL | Status: DC | PRN

## 2023-02-03 MED ORDER — VANCOMYCIN HCL 1500 MG/300ML IV SOLN: 1500.0000 mg | Freq: Once | INTRAVENOUS | Status: DC

## 2023-02-03 NOTE — ED Notes (Signed)
Transport to CT with RT

## 2023-02-03 NOTE — H&P (Signed)
NAME:  [name], MRN:  [id number], DOB:  [birth year], LOS: 0 ADMISSION DATE:  02/07/23, CONSULTATION DATE:  February 07, 2023 REFERRING MD:  Dr. Durwin Nora - EDP, CHIEF COMPLAINT:  Respiratory distress with AMS  History of Present Illness:  [name] is a 72 y.o. male with PMH significant for seizures, dementia, diabetes, HTN, HLD, and anxiety who presented from SNF after unwitnessed fall out of bed at 0630 with observed decline in mentation and progressive dyspnea post fall. On EMS arrival patient was seen hypoxic and unresponsive resulting in BMV assistance.  On ED arrival GCS 7 with no spontaneous movement seen in any extremity, he was intubated on arrival. Postintubation patient became and required low dose pressor support. Pertinent lab work on admission; glucose 251, creatinine 1.71,  WBC 18.4, Hgb 12.4, lactic 2.9. Neurosurgery was consulted and recommend medical management and additional imaging. Decision made to transfer to [facility] under PCCM for further assistance in management   Pertinent  Medical History  Seizures, TBI dementia, diabetes, HTN, HLD, and anxiety   Significant Hospital Events: Including procedures, antibiotic start and stop dates in addition to other pertinent events   02-07-23 presented from SNF after fall out of bed, CT C-spine with acute minimally displaced C5 fracture with larger prevertebral soft tissue mass likely hematoma   Interim History / Subjective:  As above   Objective   Blood pressure (!) 139/107, pulse (!) 110, temperature (!) 97.5 F (36.4 C), temperature source Rectal, resp. rate 17, height 6' (1.829 m), weight 75.8 kg, SpO2 100%.    Vent Mode: PRVC FiO2 (%):  [100 %] 100 % Set Rate:  [18 bmp] 18 bmp Vt Set:  [560 mL] 560 mL PEEP:  [5 cmH20] 5 cmH20 Plateau Pressure:  [15 cmH20] 15 cmH20   Intake/Output Summary (Last 24 hours) at 02/07/23 1322 Last data filed at 02/07/2023 1311 Gross per 24 hour  Intake 38.74 ml  Output --  Net 38.74 ml   Filed  Weights   02/07/2023 1200  Weight: 75.8 kg    Examination: General: Chronically ill appearing elderly man on mechanical ventilation, in NAD HEENT: ETT, MM pink/moist, PERRL, C collar in place Neuro: some purposeful movement in arms, no motor response in legs CV: s1s2 regular rate and rhythm, no murmur, rubs, or gallops,  PULM:  mild rhonci, passive on vent GI: soft, bowel sounds active in all 4 quadrants, non-tender, non-distended Extremities: no edema, contracted LE  Skin: no rashes or lesions  Resolved Hospital Problem list     Assessment & Plan:  Minimally displaced C5-6 vertebral body fracture without obvious spinal canal involvement.  -CT C-spine with acute minimally displaced C5 fracture with larger prevertebral soft tissue mass likely hematoma History of seizures -Medication reconciliation not yet completed but appears patient may be on Depakote and Keppra Dementia History of TBI P: NSGY consulted no surgical indications currently  Imobilize with cervical collar  MRI C-spine pending  Maintain neuro protective measures Nutrition and bowel regiment  Seizure precautions  Aspirations precautions  Home AEDs resumed: PharmD tech to see if this is current  Acute Hypoxic / Hypercapnic Respiratory Failure  -Presumed secondary to high C-spine injury, no clinical evidence of infection P: Continue ventilator support with lung protective strategies  Wean PEEP and FiO2 for sats greater than 90%. Head of bed elevated 30 degrees. Plateau pressures less than 30 cm H20.  Follow intermittent chest x-ray and ABG.   SAT/SBT as tolerated, mentation preclude extubation  Ensure adequate pulmonary hygiene  Follow cultures  VAP bundle in place  PAD protocol  Essential hypertension Hyperlipidemia P: Continuous telemetry Resume home medications when appropriate  Type 2 diabetes -Complete medication reconciliation prior to resumption of home medications P: SSI CBG goal 140-180 CBG  checks ACHS  Acute kidney injury -Creatinine on admission 1.71 with GFR 43, comparison creatinine 1.04 August 2019 P: Follow renal function  Monitor urine output Trend Bmet Avoid nephrotoxins Ensure adequate renal perfusion  IV hydration Renal US in am if remains elevated  Leukocytosis with lactic acidosis -WBC 18.4 with lactic acid 2.9 > 4.1 P: Ongoing infection workup Check blood cultures and urine culture Trend CBC and fever curve Empiric IV vanc/zosyn MRSA PCR  GOC- PMT consult depending on clinical trajectory  Best Practice (right click and "Reselect all SmartList Selections" daily)   Diet/type: NPO DVT prophylaxis: SCD GI prophylaxis: PPI Lines: N/A Foley:  N/A Code Status:  full code Last date of multidisciplinary goals of care discussion: Pending  Labs   CBC: Recent Labs  Lab 02-06-2023 0839  WBC 18.4*  NEUTROABS 8.5*  HGB 12.4*  HCT 38.0*  MCV 95.7  PLT 297    Basic Metabolic Panel: Recent Labs  Lab February 06, 2023 0839  NA 138  K 4.9  CL 103  CO2 26  GLUCOSE 251*  BUN 29*  CREATININE 1.71*  CALCIUM 8.7*   GFR: Estimated Creatinine Clearance: 43.7 mL/min (A) (by C-G formula based on SCr of 1.71 mg/dL (H)). Recent Labs  Lab 02-06-2023 0839 2023-02-06 1047  WBC 18.4*  --   LATICACIDVEN  --  2.9*    Liver Function Tests: Recent Labs  Lab 02-06-2023 0839  AST 23  ALT 15  ALKPHOS 99  BILITOT 0.8  PROT 7.0  ALBUMIN 3.3*   No results for input(s): "LIPASE", "AMYLASE" in the last 168 hours. No results for input(s): "AMMONIA" in the last 168 hours.  ABG    Component Value Date/Time   PHART 7.4 Feb 06, 2023 0943   PCO2ART 35 Feb 06, 2023 0943   PO2ART 167 (H) 2023-02-06 0943   HCO3 21.8 02/06/23 0943   TCO2 30 08/29/2012 1453   ACIDBASEDEF 2.6 (H) February 06, 2023 0943   O2SAT 100 2023/02/06 0943     Coagulation Profile: Recent Labs  Lab 2023/02/06 0839  INR 1.0    Cardiac Enzymes: No results for input(s): "CKTOTAL", "CKMB", "CKMBINDEX",  "TROPONINI" in the last 168 hours.  HbA1C: Hgb A1c MFr Bld  Date/Time Value Ref Range Status  08/01/2019 05:06 PM 7.4 (H) 4.8 - 5.6 % Final    Comment:    (NOTE) Pre diabetes:          5.7%-6.4% Diabetes:              >6.4% Glycemic control for   <7.0% adults with diabetes   09/09/2012 04:59 AM 5.4 <5.7 % Final    Comment:    (NOTE)                                                                       According to the ADA Clinical Practice Recommendations for 2011, when HbA1c is used as a screening test:  >=6.5%   Diagnostic of Diabetes Mellitus           (if  abnormal result is confirmed) 5.7-6.4%   Increased risk of developing Diabetes Mellitus References:Diagnosis and Classification of Diabetes Mellitus,Diabetes Care,2011,34(Suppl 1):S62-S69 and Standards of Medical Care in         Diabetes - 2011,Diabetes Care,2011,34 (Suppl 1):S11-S61.    CBG: No results for input(s): "GLUCAP" in the last 168 hours.  Review of Systems:   Unable to assess  Past Medical History:  He,  has a past medical history of Anxiety, Dementia (HCC), Diabetes mellitus, Falls frequently, GERD (gastroesophageal reflux disease), Head injury, Hyperlipidemia, Hypertension, Hypopotassemia, MDD (major depressive disorder), Muscle weakness, Oropharyngeal dysphagia, Seizure (HCC), and Seizures (HCC).   Surgical History:   Past Surgical History:  Procedure Laterality Date   TRACHEOSTOMY     TRACHEOSTOMY CLOSURE       Social History:   reports that he has never smoked. He does not have any smokeless tobacco history on file. He reports that he does not drink alcohol and does not use drugs.   Family History:  His family history includes Liver cancer in his mother; Stroke in his father; Throat cancer in his brother.   Allergies No Known Allergies   Home Medications  Prior to Admission medications   Medication Sig Start Date End Date Taking? Authorizing Provider  acetaminophen (TYLENOL) 650 MG CR  tablet Take 650 mg by mouth every 4 (four) hours as needed. pain    [provider]  amLODipine (NORVASC) 10 MG tablet Take 10 mg by mouth daily.    [provider]  aspirin 81 MG tablet Take 81 mg by mouth daily.    [provider]  buPROPion (WELLBUTRIN XL) 150 MG 24 hr tablet Take 1 tablet (150 mg total) by mouth daily. 08/07/19   Vassie Loll, MD  Cholecalciferol 50 MCG (2000 UT) TABS Take 1 tablet by mouth daily.    [provider]  divalproex (DEPAKOTE) 500 MG DR tablet Take 1 tablet by mouth 3 (three) times daily. 07/13/19   [provider]  famotidine (PEPCID) 10 MG tablet Take 20 mg by mouth daily.     [provider]  glipiZIDE (GLUCOTROL XL) 5 MG 24 hr tablet Take 5 mg by mouth daily.    [provider]  guaiFENesin-dextromethorphan (ROBITUSSIN DM) 100-10 MG/5ML syrup Take 10 mLs by mouth every 4 (four) hours as needed for cough. 08/06/19   Vassie Loll, MD  levETIRAcetam (KEPPRA) 500 MG tablet Take 1 tablet (500 mg total) by mouth 2 (two) times daily. 09/13/12   Butch Penny, MD  LORazepam (ATIVAN) 0.5 MG tablet Take 0.5 mg by mouth every 8 (eight) hours. For epilepsy given at 0600,1400,2200    [provider]  magnesium hydroxide (MILK OF MAGNESIA) 400 MG/5ML suspension Take 30 mLs by mouth daily as needed. constipation    [provider]  megestrol (MEGACE ES) 625 MG/5ML suspension Take 625 mg by mouth daily. Strength not listed on nursing home MAR. He is taking 15cc daily.    [provider]  metFORMIN (GLUCOPHAGE) 500 MG tablet Take 500 mg by mouth 2 (two) times daily with a meal.    [provider]  mirtazapine (REMERON) 30 MG tablet Take 30 mg by mouth at bedtime.    [provider]  Multiple Vitamin (MULTIVITAMIN) capsule Take 1 capsule by mouth daily.    [provider]  phenytoin (DILANTIN) 50 MG tablet Chew 1 tablet by mouth 3 (three) times daily. 07/20/19    [provider]  polyethylene glycol (MIRALAX /  GLYCOLAX) packet Take 17 g by mouth daily.    [provider]  predniSONE (DELTASONE) 20 MG tablet 3 tablets daily x1 day; then 2 tablets daily x2 days; then 1 tablet daily x2 days; then half tablet daily x3 days and stop prednisone. 08/06/19   Vassie Loll, MD  rosuvastatin (CRESTOR) 10 MG tablet Take 10 mg by mouth at bedtime. 06/25/19   [provider]  senna-docusate (SENOKOT-S) 8.6-50 MG per tablet Take 1 tablet by mouth daily.    [provider]  sertraline (ZOLOFT) 50 MG tablet Take 2 tablets (100 mg total) by mouth 2 (two) times daily. 08/06/19   Vassie Loll, MD     Critical care time:   CRITICAL CARE Performed by: Whitney D. Consuella Lose   Total critical care time: 42 minutes  Critical care time was exclusive of separately billable procedures and treating other patients.  Critical care was necessary to treat or prevent imminent or life-threatening deterioration.  Critical care was time spent personally by me on the following activities: development of treatment plan with patient and/or surrogate as well as nursing, discussions with consultants, evaluation of patient's response to treatment, examination of patient, obtaining history from patient or surrogate, ordering and performing treatments and interventions, ordering and review of laboratory studies, ordering and review of radiographic studies, pulse oximetry and re-evaluation of patient's condition.  Myrla Halsted MD Whitney D. Harris, NP-C [facility] Personal contact information can be found on Amion  If no contact or response made please call [phone] 02/10/2023, 1:55 PM

## 2023-02-03 NOTE — Progress Notes (Signed)
Nursing home resident with multiple significant ongoing issues including history of dementia.  Patient status post fall.  Workup demonstrates evidence of a nondisplaced C5-6 vertebral body fracture without obvious spinal canal involvement.  Recommend cervical collar for immobilization.  MRI scan of the C-spine to rule out significant epidural hemorrhage.  No plans for surgical intervention at present..  Mobilization.

## 2023-02-03 NOTE — ED Triage Notes (Signed)
Pt BIB [facility] with reports of unwitnessed fall at 0630 this morning. Since that time pt has had mental decline. Pt being bagged by EMS at this time.

## 2023-02-03 NOTE — ED Notes (Signed)
Both sets of cultures drawn before any antibiotic administration  

## 2023-02-03 NOTE — Plan of Care (Signed)
  Problem: Fluid Volume: Goal: Ability to maintain a balanced intake and output will improve Outcome: Progressing   Problem: Metabolic: Goal: Ability to maintain appropriate glucose levels will improve Outcome: Progressing   Problem: Nutritional: Goal: Progress toward achieving an optimal weight will improve Outcome: Progressing   Problem: Skin Integrity: Goal: Risk for impaired skin integrity will decrease Outcome: Progressing   Problem: Tissue Perfusion: Goal: Adequacy of tissue perfusion will improve Outcome: Progressing   Problem: Safety: Goal: Non-violent Restraint(s) Outcome: Not Progressing   Problem: Education: Goal: Ability to describe self-care measures that may prevent or decrease complications (Diabetes Survival Skills Education) will improve Outcome: Not Progressing Goal: Individualized Educational Video(s) Outcome: Not Progressing   Problem: Coping: Goal: Ability to adjust to condition or change in health will improve Outcome: Not Progressing   Problem: Health Behavior/Discharge Planning: Goal: Ability to identify and utilize available resources and services will improve Outcome: Not Progressing Goal: Ability to manage health-related needs will improve Outcome: Not Progressing   Problem: Nutritional: Goal: Maintenance of adequate nutrition will improve Outcome: Not Progressing

## 2023-02-03 NOTE — ED Notes (Signed)
Unable to complete triage due to patient condition at time of arrival.

## 2023-02-03 NOTE — Progress Notes (Addendum)
Pharmacy Antibiotic Note  [name] is a 72 y.o. male admitted on 02/12/2023 with  unknown source of infection .  Pharmacy has been consulted for Vancomycin dosing.  Plan: Vancomycin 1500mg  IV loading dose then 1000 mg IV Q 24 hrs. Goal AUC 400-550. Expected AUC: 451 SCr used: 1.7 Cefepime 2gm IV q12h F/U cxs and clinical progress Monitor V/S, labs and levels as indicated   Height: 6' (182.9 cm) Weight: 75.8 kg (167 lb) IBW/kg (Calculated) : 77.6  Temp (24hrs), Avg:98.5 F (36.9 C), Min:98.5 F (36.9 C), Max:98.5 F (36.9 C)  Recent Labs  Lab 01/20/2023 0839 01/25/2023 1047  WBC 18.4*  --   CREATININE 1.71*  --   LATICACIDVEN  --  2.9*    Estimated Creatinine Clearance: 43.7 mL/min (A) (by C-G formula based on SCr of 1.71 mg/dL (H)).    No Known Allergies  Antimicrobials this admission: Vancomycin  7/20 >>  Cefepime 7/20>> Metronidazole 7/20  Microbiology results: 7/20 BCx: pending 7/20 UCx: pending   MRSA PCR:   Thank you for allowing pharmacy to be a part of this patient's care.  Elder Cyphers, BS Pharm D, BCPS Clinical Pharmacist 01/22/2023 12:12 PM

## 2023-02-03 NOTE — Progress Notes (Signed)
Pharmacy Antibiotic Note  [name] is a 72 y.o. male admitted on 01/27/2023 with pneumonia.  Pharmacy has been consulted for Zosyn dosing for aspiration.  Plan: Zosyn 3.375gm IV q8h (4hr extended infusions) Continue vancomycin as previously ordered Follow SCr daily - BMET ordered through 7/25  Height: 6' (182.9 cm) Weight: 75.8 kg (167 lb) IBW/kg (Calculated) : 77.6  Temp (24hrs), Avg:97.9 F (36.6 C), Min:97.5 F (36.4 C), Max:98.5 F (36.9 C)  Recent Labs  Lab 01/18/2023 0839 01/22/2023 1047 02/02/2023 1304  WBC 18.4*  --   --   CREATININE 1.71*  --   --   LATICACIDVEN  --  2.9* 4.1*    Estimated Creatinine Clearance: 43.7 mL/min (A) (by C-G formula based on SCr of 1.71 mg/dL (H)).    No Known Allergies  Antimicrobials this admission: 7/20 Cefepime/Flagyl x 1 7/20 Vanc >> 7/20 Zosyn >>  Dose adjustments this admission:  Microbiology results: 7/20 BCx:  7/20 MRSA PCR: 7/20 UCx:  Thank you for allowing pharmacy to be a part of this patient's care.  Loralee Pacas, PharmD, BCPS 01/15/2023 3:50 PM  Please check AMION for all [facility] Pharmacy phone numbers After 10:00 PM, call Main Pharmacy [phone]

## 2023-02-03 NOTE — ED Notes (Signed)
[facility] at bedside for transport. 

## 2023-02-03 NOTE — Progress Notes (Signed)
60 Versed wasted in steri cycle with Rolm Bookbinder, RN. (Med discontinued in Pyxis.)   This RN spilled pt bag of fentanyl in MRI. New bag given on arrival to unit.
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
## 2023-02-03 NOTE — ED Provider Notes (Signed)
[place] EMERGENCY DEPARTMENT AT [facility] Provider Note   CSN: [id number] Arrival date & time: 01/21/2023  0830     History  Chief Complaint  Patient presents with   Respiratory Distress    [name] is a 72 y.o. male.  HPI Patient presents for altered mental status.  Medical history includes dementia, DM, anxiety, GERD, HLD, HTN, seizures.  He arrives from skilled nursing facility via EMS.  Nursing facility states that he fell out of bed at approximately 6:30 AM.  He has had progressive decline in mentation as well as increased difficulty breathing since that time.  When EMS arrived on scene, patient had labored breathing, hypoxia, and was unresponsive.  They initiated BVM assistance with respirations.  Patient was noted to be normotensive with glucose in the 200s.  History per sister: Patient has been nonambulatory since a TBI in 1996.  At baseline, he is able to feed himself and is conversant.  She last saw him 2 days ago, during which time he seemed fine to her.  He has recently been treated for UTI.  She spoke with him on the phone last night, at which time he also seemed fine.  Sister is his only direct relative.  He does not have a DNR or MOST form in place.  She is unsure of what his wishes would be for aggressive intervention/prolonged life support.    Home Medications Prior to Admission medications   Medication Sig Start Date End Date Taking? Authorizing Provider  acetaminophen (TYLENOL) 650 MG CR tablet Take 650 mg by mouth every 4 (four) hours as needed. pain    [provider]  amLODipine (NORVASC) 10 MG tablet Take 10 mg by mouth daily.    [provider]  aspirin 81 MG tablet Take 81 mg by mouth daily.    [provider]  buPROPion (WELLBUTRIN XL) 150 MG 24 hr tablet Take 1 tablet (150 mg total) by mouth daily. 08/07/19   Vassie Loll, MD  Cholecalciferol 50 MCG (2000 UT) TABS Take 1 tablet by mouth daily.    [provider]  divalproex (DEPAKOTE) 500 MG DR tablet Take 1 tablet by mouth 3 (three) times daily. 07/13/19   [provider]  famotidine (PEPCID) 10 MG tablet Take 20 mg by mouth daily.     [provider]  glipiZIDE (GLUCOTROL XL) 5 MG 24 hr tablet Take 5 mg by mouth daily.    [provider]  guaiFENesin-dextromethorphan (ROBITUSSIN DM) 100-10 MG/5ML syrup Take 10 mLs by mouth every 4 (four) hours as needed for cough. 08/06/19   Vassie Loll, MD  levETIRAcetam (KEPPRA) 500 MG tablet Take 1 tablet (500 mg total) by mouth 2 (two) times daily. 09/13/12   Butch Penny, MD  LORazepam (ATIVAN) 0.5 MG tablet Take 0.5 mg by mouth every 8 (eight) hours. For epilepsy given at 0600,1400,2200    [provider]  magnesium hydroxide (MILK OF MAGNESIA) 400 MG/5ML suspension Take 30 mLs by mouth daily as needed. constipation    [provider]  megestrol (MEGACE ES) 625 MG/5ML suspension Take 625 mg by mouth daily. Strength not listed on nursing home MAR. He is taking 15cc daily.    [provider]  metFORMIN (GLUCOPHAGE) 500 MG tablet Take 500 mg by mouth 2 (two) times daily with a meal.    [provider]  mirtazapine (REMERON) 30 MG tablet Take 30 mg by mouth at bedtime.    [provider]  Multiple Vitamin (MULTIVITAMIN) capsule Take 1 capsule by mouth daily.    [provider]  phenytoin (DILANTIN) 50 MG tablet Chew 1 tablet by mouth 3 (three) times daily. 07/20/19   [provider]  polyethylene glycol (MIRALAX / GLYCOLAX) packet Take 17 g by mouth daily.    [provider]  predniSONE (DELTASONE) 20 MG tablet 3 tablets daily x1 day; then 2 tablets daily x2 days; then 1 tablet daily x2 days; then half tablet daily x3 days and stop prednisone. 08/06/19   Vassie Loll, MD  rosuvastatin (CRESTOR) 10 MG tablet Take 10 mg by mouth at bedtime. 06/25/19   [provider]  senna-docusate (SENOKOT-S) 8.6-50  MG per tablet Take 1 tablet by mouth daily.    [provider]  sertraline (ZOLOFT) 50 MG tablet Take 2 tablets (100 mg total) by mouth 2 (two) times daily. 08/06/19   Vassie Loll, MD      Allergies    Patient has no known allergies.    Review of Systems   Review of Systems  Unable to perform ROS: Mental status change    Physical Exam Updated Vital Signs BP 119/88   Pulse (!) 118   Temp 98.5 F (36.9 C) (Axillary)   Resp 18   Ht 6' (1.829 m)   Wt 75.8 kg   SpO2 100%   BMI 22.65 kg/m  Physical Exam Vitals and nursing note reviewed.  Constitutional:      General: He is in acute distress.     Appearance: He is well-developed. He is ill-appearing. He is not toxic-appearing or diaphoretic.  HENT:     Head: Normocephalic.     Comments: Laceration/abrasion to right eyebrow area    Right Ear: External ear normal.     Left Ear: External ear normal.     Nose: Nose normal.     Mouth/Throat:     Mouth: Mucous membranes are moist.  Eyes:     Conjunctiva/sclera: Conjunctivae normal.     Comments: Pupils dilated and minimally responsive.  Cardiovascular:     Rate and Rhythm: Regular rhythm. Tachycardia present.  Pulmonary:     Effort: Tachypnea, accessory muscle usage and retractions present. No respiratory distress.     Breath sounds: Decreased air movement present. Rhonchi present.  Abdominal:     General: There is no distension.     Palpations: Abdomen is soft.  Musculoskeletal:        General: No swelling or deformity.     Cervical back: Neck supple.     Right lower leg: No edema.     Left lower leg: No edema.  Skin:    General: Skin is warm and dry.     Coloration: Skin is not jaundiced.  Neurological:     GCS: GCS eye subscore is 1. GCS verbal subscore is 1. GCS motor subscore is 5.     ED Results / Procedures / Treatments   Labs (all labs ordered are listed, but only abnormal results are displayed) Labs Reviewed  CBC - Abnormal; Notable for the  following components:      Result Value   WBC 18.4 (*)    RBC 3.97 (*)    Hemoglobin 12.4 (*)    HCT 38.0 (*)    All other components within normal limits  COMPREHENSIVE METABOLIC PANEL - Abnormal; Notable for the following components:   Glucose, Bld 251 (*)    BUN 29 (*)    Creatinine, Ser 1.71 (*)  Calcium 8.7 (*)    Albumin 3.3 (*)    GFR, Estimated 43 (*)    All other components within normal limits  BLOOD GAS, ARTERIAL - Abnormal; Notable for the following components:   pO2, Arterial 167 (*)    Acid-base deficit 2.6 (*)    All other components within normal limits  LACTIC ACID, PLASMA - Abnormal; Notable for the following components:   Lactic Acid, Venous 2.9 (*)    All other components within normal limits  BRAIN NATRIURETIC PEPTIDE - Abnormal; Notable for the following components:   B Natriuretic Peptide 143.0 (*)    All other components within normal limits  DIFFERENTIAL - Abnormal; Notable for the following components:   Neutro Abs 8.5 (*)    Lymphs Abs 8.3 (*)    Eosinophils Absolute 0.9 (*)    Basophils Absolute 0.2 (*)    All other components within normal limits  RESP PANEL BY RT-PCR (RSV, FLU A&B, COVID)  [id number]  CULTURE, BLOOD (ROUTINE X 2)  CULTURE, BLOOD (ROUTINE X 2)  PROTIME-INR  APTT  ETHANOL  VALPROIC ACID LEVEL  LACTIC ACID, PLASMA  URINALYSIS, W/ REFLEX TO CULTURE (INFECTION SUSPECTED)  LEVETIRACETAM LEVEL  PHENYTOIN LEVEL, FREE AND TOTAL  I-STAT CHEM 8, ED  TROPONIN I (HIGH SENSITIVITY)  TROPONIN I (HIGH SENSITIVITY)    EKG None  Radiology CT HEAD WO CONTRAST  Result Date: 01/24/2023 CLINICAL DATA:  Mental status change.  Un witnessed fall. EXAM: CT HEAD WITHOUT CONTRAST CT CERVICAL SPINE WITHOUT CONTRAST TECHNIQUE: Multidetector CT imaging of the head and cervical spine was performed following the standard protocol without intravenous contrast. Multiplanar CT image reconstructions of the cervical spine were also generated. RADIATION DOSE  REDUCTION: This exam was performed according to the departmental dose-optimization program which includes automated exposure control, adjustment of the mA and/or kV according to patient size and/or use of iterative reconstruction technique. COMPARISON:  CT head 08/01/2019 and CT cervical spine 12/27/2015. FINDINGS: CT HEAD FINDINGS Brain: No evidence of acute infarction, hemorrhage, hydrocephalus, extra-axial collection or mass lesion/mass effect. There is diffuse cerebral atrophy and signs of chronic small vessel ischemic disease. Left frontotemporal encephalomalacia is identified compatible with remote infarct. There is also a remote infarct noted within the right cerebellar hemisphere. Prominence of the sulci and ventricles compatible with brain atrophy. Vascular: No hyperdense vessel or unexpected calcification. Skull: Normal. Negative for fracture or focal lesion. Sinuses/Orbits: Paranasal sinuses are clear.  Orbits appear normal. Other: None CT CERVICAL SPINE FINDINGS Alignment: The alignment appears within normal limits. The facet joints appear well aligned. Skull base and vertebrae: There is an acute, nondisplaced fracture involving the C5 vertebral body. The fracture line spans the AP diameter of the vertebral body. Extension into the C5-6 disc space is identified, image 30/5. There is also extension through the right posteroinferior cortex. No retropulsion of fracture fragments identified. This is a new finding when compared with the exam from 01/04/2016. No additional fracture or subluxation identified. Soft tissues and spinal canal: No prevertebral fluid or swelling. No visible canal hematoma. Disc levels: Multilevel disc space narrowing with large, ventral osteophytes. Upper chest: Imaged portions of the lung apices are unremarkable. Other: There is a large heterogeneous mass within the precervical/retropharyngeal soft tissues. This measures 4.7 x 3.5 by at least 9.5 cm. This has mass effect upon the  cervical airway and esophagus which is displaced ventrally. Signs of intubation and NG tube placement. IMPRESSION: 1. No acute intracranial abnormalities. 2. Chronic small vessel ischemic disease  and brain atrophy. 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 3. Acute, two column, nondisplaced fracture involving the C5 vertebral body with extension into the C5-6 disc space. No retropulsion of fracture fragments identified. 4. Large heterogeneous mass within the pre-cervical/retropharyngeal soft tissues. This has mass effect upon the cervical airway and esophagus which are displaced ventrally. Favor large hematoma in light of recent trauma. A underlying tumor is less favored but cannot be excluded with a high degree of certainty. Follow-up imaging advised to ensure resolution. In the absence of resolution further investigation with contrast enhance MRI or CT of the cervical spine is advised. 5. Cervical degenerative disc disease. Electronically Signed   By: Signa Kell M.D.   On: 02/13/2023 10:16   CT Cervical Spine Wo Contrast  Result Date: 01/31/2023 CLINICAL DATA:  Neck trauma with mental status change. Unwitnessed fall this morning. EXAM: CT CERVICAL SPINE WITHOUT CONTRAST TECHNIQUE: Multidetector CT imaging of the cervical spine was performed without intravenous contrast. Multiplanar CT image reconstructions were also generated. RADIATION DOSE REDUCTION: This exam was performed according to the departmental dose-optimization program which includes automated exposure control, adjustment of the mA and/or kV according to patient size and/or use of iterative reconstruction technique. COMPARISON:  CT cervical spine 12/27/2015 FINDINGS: Alignment: Normal. Skull base and vertebrae: There are changes of diffuse idiopathic skeletal hyperostosis with partially bridging anterior osteophytes at C4-5 and C5-6 (rigid spine). There is an acute oblique  fracture through the C5 vertebral body, best seen on the sagittal and coronal images. No involvement of the foramen transverse area or posterior elements. The fracture is minimally displaced. No other fractures are identified. Soft tissues and spinal canal: Endotracheal and nasogastric tubes are in place. Both are displaced anteriorly by a slightly heterogeneous oval prevertebral soft tissue mass measuring up to 4.6 x 3.5 cm in diameter. This extends superiorly to C3-4 and inferiorly into the upper chest, most likely a prevertebral hematoma related to the cervical spine fracture. Mass lesion difficult to exclude by this examination. Disc levels: Mild multilevel spondylosis with disc bulging and endplate osteophytes. As above, progressive partially bridging anterior osteophytes at C4-5 and C5-6. No significant spinal stenosis or foraminal narrowing. No evidence of epidural hematoma. Upper chest: Chest CT findings are dictated separately. Other: None. IMPRESSION: 1. Acute minimally displaced fracture through the C5 vertebral body without involvement of the foramen or posterior elements. 2. Underlying diffuse idiopathic skeletal hyperostosis with partially bridging anterior osteophytes at C4-5 and C5-6 (rigid spine). 3. Large prevertebral soft tissue mass, most likely an acute hematoma related to the cervical spine fracture. Mass lesion difficult to exclude by this examination. In discussion with Dr. Durwin Nora, there was no resistance to placement of the enteric tube to suggest an esophageal lesion. 4. No evidence of epidural hematoma or significant spinal stenosis. 5. Critical Value/emergent results were called by telephone at the time of interpretation on 01/30/2023 at 9:58 am to provider Gloris Manchester , who verbally acknowledged these results. Electronically Signed   By: Carey Bullocks M.D.   On: 01/28/2023 09:59   CT CHEST ABDOMEN PELVIS WO CONTRAST  Result Date: 02/04/2023 CLINICAL DATA:  Polytrauma, blunt EXAM: CT  CHEST, ABDOMEN AND PELVIS WITHOUT CONTRAST TECHNIQUE: Multidetector CT imaging of the chest, abdomen and pelvis was performed following the standard protocol without IV contrast. RADIATION DOSE REDUCTION: This exam was performed according to the departmental dose-optimization program which includes automated exposure control, adjustment of the mA and/or kV according to  patient size and/or use of iterative reconstruction technique. COMPARISON:  None Available. FINDINGS: CT CHEST FINDINGS Cardiovascular: Heart size normal. No pericardial effusion. Scattered descending thoracic aortic calcifications. Central great vessels otherwise grossly unremarkable. Mediastinum/Nodes: Large lower cervical prevertebral hematoma extends into the posterior mediastinum. No mass or adenopathy. Lungs/Pleura: No pleural effusion. No pneumothorax. Scattered coarse posterior lower lobe subsegmental atelectasis or scarring. Musculoskeletal: No chest wall mass or suspicious bone lesions identified. CT ABDOMEN PELVIS FINDINGS Hepatobiliary: Multiple large partially calcified gallstones up to 3.3 cm. Gallbladder nondilated. No focal liver lesion or biliary ductal dilatation. Pancreas: Unremarkable. No pancreatic ductal dilatation or surrounding inflammatory changes. Spleen: Normal in size without focal abnormality. Adrenals/Urinary Tract: No adrenal mass. Bilateral nephrolithiasis, largest in the left lower pole 1.1 cm, on the right 5 mm in the lower pole. No hydronephrosis. Symmetric renal contours. Urinary bladder physiologically distended. Stomach/Bowel: Gastric tube loops in the nondistended stomach. Small bowel is decompressed. Appendix not identified. Moderate colonic fecal material mild rectal distension. Vascular/Lymphatic: Scattered aortoiliac calcified plaque without aneurysm. No abdominal or pelvic adenopathy. Reproductive: Mild prostate enlargement. Bilateral inguinal hernias containing only fat. Other: . no ascites.  No free air.  Musculoskeletal: Mild L1 vertebral compression deformity, age indeterminate. Corticated fragments posterior to the right hip suggesting free osteochondral bodies. IMPRESSION: 1. Large lower cervical prevertebral hematoma extending into the posterior mediastinum. 2. Mild L1 vertebral compression deformity, age indeterminate. 3. Cholelithiasis. 4. Bilateral nephrolithiasis. 5.  Aortic Atherosclerosis (ICD10-I70.0). Electronically Signed   By: Corlis Leak M.D.   On: 02/09/2023 09:57   DG Chest Portable 1 View  Result Date: 02/09/2023 CLINICAL DATA:  s/p ETT AND OG EXAM: PORTABLE CHEST - 1 VIEW COMPARISON:  08/01/2019 FINDINGS: Endotracheal tube placement, tip 2 cm above carina. Gastric tube loops in the decompressed stomach. No pneumothorax. Lungs are clear. Heart size and mediastinal contours are within normal limits. No effusion. Visualized bones unremarkable. IMPRESSION: Endotracheal and gastric tube placement as above. No acute findings. Electronically Signed   By: Corlis Leak M.D.   On: 02/09/2023 09:14    Procedures Procedure Name: Intubation Date/Time: 02/09/23 8:50 AM  Performed by: Gloris Manchester, MDOxygen Delivery Method: Ambu bag Preoxygenation: Pre-oxygenation with 100% oxygen Induction Type: Rapid sequence Ventilation: Mask ventilation without difficulty Laryngoscope Size: Glidescope and 4 Grade View: Grade II Tube size: 7.5 mm Number of attempts: 1 Airway Equipment and Method: Rigid stylet and Video-laryngoscopy Placement Confirmation: ETT inserted through vocal cords under direct vision, CO2 detector and Breath sounds checked- equal and bilateral Secured at: 23 cm Tube secured with: ETT holder Dental Injury: Teeth and Oropharynx as per pre-operative assessment         Medications Ordered in ED Medications  fentaNYL in NS (64mcg/ml) infusion-PREMIX (25 mcg/hr Intravenous New Bag/Given February 09, 2023 0908)  fentaNYL (SUBLIMAZE) bolus via infusion 25-100 mcg (has no  administration in time range)  midazolam (VERSED) 100 mg/100 mL (1 mg/mL) premix infusion (0.5 mg/hr Intravenous New Bag/Given 02/09/23 0948)  0.9 %  sodium chloride infusion (125 mL/hr Intravenous New Bag/Given 02-09-23 0909)  lactated ringers infusion (0 mLs Intravenous Hold 2023-02-09 1122)  metroNIDAZOLE (FLAGYL) IVPB 500 mg (has no administration in time range)  vancomycin (VANCOREADY) IVPB 1500 mg/300 mL (has no administration in time range)    Followed by  vancomycin (VANCOCIN) IVPB 1000 mg/200 mL premix (has no administration in time range)  ceFEPIme (MAXIPIME) 2 g in sodium chloride 0.9 % 100 mL IVPB (has no administration in time range)  rocuronium bromide 100 MG/10ML  SOSY (100 mg  Given 02/04/2023 0847)  etomidate (AMIDATE) 2 MG/ML injection (20 mg  Given 02/09/2023 0847)  norepinephrine (LEVOPHED) 4-5 MG/250ML-% infusion SOLN (4 mg  New Bag/Given 02/10/2023 0847)  fentaNYL (SUBLIMAZE) injection 25 mcg (25 mcg Intravenous Given 02/13/2023 0909)  ceFEPIme (MAXIPIME) 2 g in sodium chloride 0.9 % 100 mL IVPB (0 g Intravenous Stopped 02/14/2023 1220)  sodium chloride 0.9 % bolus 1,000 mL (1,000 mLs Intravenous Bolus 02/06/2023 1123)    ED Course/ Medical Decision Making/ A&P                             Medical Decision Making Amount and/or Complexity of Data Reviewed Labs: ordered. Radiology: ordered. ECG/medicine tests: ordered.  Risk Prescription drug management. Decision regarding hospitalization.   This patient presents to the ED for concern of altered mental status, this involves an extensive number of treatment options, and is a complaint that carries with it a high risk of complications and morbidity.  The differential diagnosis includes CVA, ICH, seizure, respiratory failure, spinal injury, metabolic derangements, infection   Co morbidities that complicate the patient evaluation  dementia, DM, anxiety, GERD, HLD, HTN, seizures   Additional history obtained:  Additional history  obtained from EMS, patient's sister External records from outside source obtained and reviewed including EMR   Lab Tests:  I Ordered, and personally interpreted labs.  The pertinent results include: Leukocytosis is present.  Anemia appears to be baseline.  Creatinine is elevated when compared to lab work from 3 years ago.   Imaging Studies ordered:  I ordered imaging studies including chest x-ray, CT of head, cervical spine, chest, abdomen, pelvis I independently visualized and interpreted imaging which showed C5 vertebral body fracture with a large prevertebral soft tissue mass and/or hematoma.  This extends into posterior mediastinum. I agree with the radiologist interpretation   Cardiac Monitoring: / EKG:  The patient was maintained on a cardiac monitor.  I personally viewed and interpreted the cardiac monitored which showed an underlying rhythm of: Sinus rhythm   Consultations Obtained:  I requested consultation with the neurosurgeon, Dr. Jordan Likes,  and discussed lab and imaging findings as well as pertinent plan - they recommend: MRI of C-spine.  No planned surgical intervention at this time. I requested consultation with the intensivist, Dr. Katrinka Blazing,  and discussed lab and imaging findings as well as pertinent plan - they recommend: Admission to [facility], ICU   Problem List / ED Course / Critical interventions / Medication management  Patient presents for altered mental status and respiratory distress.  This was reportedly after a fall out of bed at 6:30 AM.  Patient reportedly had progressive decline since then.  On arrival, patient is GCS 7.  He does not appear to be moving his extremities.  He will move his head in response to BVM assisted respirations, which are taking place on arrival.  Patient was placed on bedside cardiac monitor.  He was found be tachycardic and hypoxic.  Continued BVM respirations were performed to preoxygenated in preparation of intubation.  Patient was intubated  for airway protection.  Following intubation, he did have drop in blood pressure, which was treated with Levophed.  He underwent CT imaging which did reveal a C5 vertebral body fracture with a prevertebral hematoma extending from this area to posterior mediastinum.  This was discussed with neurosurgery who does not plan on any surgical intervention at this time.  They did recommend obtaining MRI of  C-spine.  This was ordered.  I spoke with intensivist, Dr. Katrinka Blazing who accepts patient for admission at [facility].  Patient continued to require 50 mcg/min of Levophed.  His heart rate did improve with IV fluids.  Given his leukocytosis and lactic acidosis, he was treated empirically for sepsis with broad-spectrum antibiotics.  Patient was transferred for further management. I ordered medication including etomidate and rocuronium for RSI; Levophed for hypotension; IV fluids and broad-spectrum antibiotics for empiric treatment of infection; fentanyl and Versed for postintubation sedation Reevaluation of the patient after these medicines showed that the patient improved I have reviewed the patients home medicines and have made adjustments as needed   Social Determinants of Health:  Resides in skilled nursing facility   CRITICAL CARE Performed by: Gloris Manchester   Total critical care time: 45 minutes  Critical care time was exclusive of separately billable procedures and treating other patients.  Critical care was necessary to treat or prevent imminent or life-threatening deterioration.  Critical care was time spent personally by me on the following activities: development of treatment plan with patient and/or surrogate as well as nursing, discussions with consultants, evaluation of patient's response to treatment, examination of patient, obtaining history from patient or surrogate, ordering and performing treatments and interventions, ordering and review of laboratory studies, ordering and review of  radiographic studies, pulse oximetry and re-evaluation of patient's condition.         Final Clinical Impression(s) / ED Diagnoses Final diagnoses:  Closed nondisplaced fracture of fifth cervical vertebra, unspecified fracture morphology, initial encounter (HCC)  Glasgow coma scale total score 3-8, in the field (EMT or ambulance) [facility])  Acute respiratory failure with hypoxia (HCC)  Respiratory distress    Rx / DC Orders ED Discharge Orders     None         Gloris Manchester, MD 02/04/2023 1226

## 2023-02-03 NOTE — Progress Notes (Signed)
eLink Physician-Brief Progress Note [name] DOB: 08-30-1950 MRN: [id number]   Date of Service  02/19/23  HPI/Events of Note  Patient has advance directive paperwork in the EMR which states he is Do not resuscitate.  I reached his next of kin sister [name] and she confirmed his wishes.  Patient does not have spouse or children.  Closest relative is sister [name].  eICU Interventions  DNR order placed in chart     Intervention Category Major Interventions: End of life / care limitation discussion  Deanna Artis 02/19/2023, 8:48 PM

## 2023-02-03 NOTE — ED Notes (Signed)
C collar placed on pt neck, pt was log rolled and C spine protected during application

## 2023-02-03 NOTE — Progress Notes (Signed)
eLink Physician-Brief Progress Note [name] DOB: 1950/10/21 MRN: [id number]   Date of Service  02/12/2023  HPI/Events of Note  Hypotension on high dose levophed. Rhythmic movements of legs and shoulder.  History of seizures not getting usual dose of ativan scheduled.  eICU Interventions  Check hgb now Scheduled dose of ativan LR bolus     Intervention Category Intermediate Interventions: Hypotension - evaluation and management  Henry Russel, P 02/13/2023, 10:05 PM

## 2023-02-04 ENCOUNTER — Other Ambulatory Visit: Payer: Self-pay

## 2023-02-04 ENCOUNTER — Inpatient Hospital Stay (HOSPITAL_COMMUNITY): Payer: Medicare (Managed Care)

## 2023-02-04 DIAGNOSIS — R569 Unspecified convulsions: Secondary | ICD-10-CM | POA: Diagnosis not present

## 2023-02-04 DIAGNOSIS — J9601 Acute respiratory failure with hypoxia: Secondary | ICD-10-CM | POA: Diagnosis not present

## 2023-02-04 DIAGNOSIS — S12401A Unspecified nondisplaced fracture of fifth cervical vertebra, initial encounter for closed fracture: Secondary | ICD-10-CM | POA: Diagnosis not present

## 2023-02-04 LAB — URINE CULTURE: Culture: <10000 — AB

## 2023-02-04 LAB — CBC
HCT: 26.3 % — ABNORMAL LOW (ref 39.0–52.0)
Hemoglobin: 9.4 g/dL — ABNORMAL LOW (ref 13.0–17.0)
MCH: 32.1 pg (ref 26.0–34.0)
MCHC: 35.7 g/dL (ref 30.0–36.0)
MCV: 89.8 fL (ref 80.0–100.0)
Platelets: 196 10*3/uL (ref 150–400)
RBC: 2.93 MIL/uL — ABNORMAL LOW (ref 4.22–5.81)
RDW: 13.5 % (ref 11.5–15.5)
WBC: 16.5 10*3/uL — ABNORMAL HIGH (ref 4.0–10.5)
nRBC: 0 % (ref 0.0–0.2)

## 2023-02-04 LAB — BASIC METABOLIC PANEL
Anion gap: 10 (ref 5–15)
BUN: 26 mg/dL — ABNORMAL HIGH (ref 8–23)
CO2: 20 mmol/L — ABNORMAL LOW (ref 22–32)
Calcium: 7.9 mg/dL — ABNORMAL LOW (ref 8.9–10.3)
Chloride: 107 mmol/L (ref 98–111)
Creatinine, Ser: 1.8 mg/dL — ABNORMAL HIGH (ref 0.61–1.24)
GFR, Estimated: 40 mL/min — ABNORMAL LOW (ref >60)
Glucose, Bld: 145 mg/dL — ABNORMAL HIGH (ref 70–99)
Potassium: 4 mmol/L (ref 3.5–5.1)
Sodium: 137 mmol/L (ref 135–145)

## 2023-02-04 LAB — TRIGLYCERIDES: Triglycerides: 103 mg/dL (ref <150)

## 2023-02-04 LAB — GLUCOSE, CAPILLARY
Glucose-Capillary: 121 mg/dL — ABNORMAL HIGH (ref 70–99)
Glucose-Capillary: 122 mg/dL — ABNORMAL HIGH (ref 70–99)
Glucose-Capillary: 130 mg/dL — ABNORMAL HIGH (ref 70–99)
Glucose-Capillary: 138 mg/dL — ABNORMAL HIGH (ref 70–99)
Glucose-Capillary: 157 mg/dL — ABNORMAL HIGH (ref 70–99)
Glucose-Capillary: 99 mg/dL (ref 70–99)

## 2023-02-04 LAB — MAGNESIUM: Magnesium: 1.5 mg/dL — ABNORMAL LOW (ref 1.7–2.4)

## 2023-02-04 LAB — PHOSPHORUS: Phosphorus: 2.4 mg/dL — ABNORMAL LOW (ref 2.5–4.6)

## 2023-02-04 MED ORDER — LORAZEPAM 2 MG/ML IJ SOLN
INTRAMUSCULAR | Status: AC
  Administered 2023-02-04: 2 mg
  Filled 2023-02-04: qty 1

## 2023-02-04 MED ORDER — LORAZEPAM 2 MG/ML IJ SOLN: 2.0000 mg | Freq: Once | INTRAMUSCULAR | Status: AC

## 2023-02-04 MED ORDER — NOREPINEPHRINE 4 MG/250ML-% IV SOLN
0.0000 ug/min | INTRAVENOUS | Status: DC
  Administered 2023-02-04: 9 ug/min via INTRAVENOUS
  Administered 2023-02-04: 6 ug/min via INTRAVENOUS
  Administered 2023-02-05: 5 ug/min via INTRAVENOUS
  Filled 2023-02-04 (×3): qty 250

## 2023-02-04 MED ORDER — SODIUM CHLORIDE 0.9% FLUSH: 10.0000 mL | INTRAVENOUS | Status: DC | PRN

## 2023-02-04 MED ORDER — SODIUM PHOSPHATES 45 MMOLE/15ML IV SOLN
15.0000 mmol | Freq: Once | INTRAVENOUS | Status: AC
  Administered 2023-02-04: 15 mmol via INTRAVENOUS
  Filled 2023-02-04: qty 5

## 2023-02-04 MED ORDER — VITAL AF 1.2 CAL PO LIQD
1000.0000 mL | ORAL | Status: DC
  Administered 2023-02-04: 1000 mL

## 2023-02-04 MED ORDER — SODIUM CHLORIDE 0.9 % IV SOLN
1.0000 g | INTRAVENOUS | Status: DC
  Administered 2023-02-04 – 2023-02-05 (×2): 1 g via INTRAVENOUS
  Filled 2023-02-04 (×2): qty 10

## 2023-02-04 MED ORDER — ACETAMINOPHEN 325 MG PO TABS: 650.0000 mg | ORAL_TABLET | Freq: Four times a day (QID) | ORAL | Status: DC | PRN

## 2023-02-04 MED ORDER — OSMOLITE 1.2 CAL PO LIQD
1000.0000 mL | ORAL | Status: DC
  Administered 2023-02-04: 1000 mL

## 2023-02-04 MED ORDER — SODIUM CHLORIDE 0.9% FLUSH
10.0000 mL | Freq: Two times a day (BID) | INTRAVENOUS | Status: DC
  Administered 2023-02-04: 20 mL
  Administered 2023-02-04: 10 mL
  Administered 2023-02-05: 30 mL

## 2023-02-04 MED ORDER — LORAZEPAM 2 MG/ML IJ SOLN: 1.0000 mg | Freq: Four times a day (QID) | INTRAMUSCULAR | Status: DC | PRN

## 2023-02-04 MED ORDER — MAGNESIUM SULFATE 4 GM/100ML IV SOLN
4.0000 g | Freq: Once | INTRAVENOUS | Status: AC
  Administered 2023-02-04: 4 g via INTRAVENOUS
  Filled 2023-02-04: qty 100

## 2023-02-04 NOTE — Progress Notes (Addendum)
eLink Physician-Brief Progress Note Patient Name: BARKLEY KRATOCHVIL DOB: Dec 27, 1950 MRN: [id number]   Date of Service  02/04/2023  HPI/Events of Note  72 y.o. male with PMH significant for seizures, dementia, diabetes, HTN, HLD, and anxiety who initially presented with a fall, C5-C6 displaced vertebral body fracture, and respiratory failure requiring intubation.  Now developing low-grade fever.  Cultures were collected on 7/21.  eICU Interventions  Add on acetaminophen as needed for fevers  Renew bilateral wrist restraints  Downtrending vasopressor support, maintain current antibiotics.   0554 - Hg 6.6, type and screen.  Prepare 2 units, transfuse 1 unit.  Add hemoglobin screen every 6 hours.  Intervention Category Minor Interventions: Routine modifications to care plan (e.g. PRN medications for pain, fever)  Jamisyn Langer 02/04/2023, 8:04 PM

## 2023-02-04 NOTE — Progress Notes (Signed)
02/04/2023   Met with family and discussed plan Assure no seizures on EEG Wean sedation and make sure can follow commands Make sure renal function stabilizes Get NSGY input  If all of above criteria are met, may be able to consider early trach/rehab.  Myrla Halsted MD PCCM

## 2023-02-04 NOTE — Progress Notes (Addendum)
eLink Physician-Brief Progress Note [name] DOB: 1951/01/09 MRN: [id number]   Date of Service  02/04/2023  HPI/Events of Note  Rhythmic movements at times. Roving eye movements. History of SZ D.O.    eICU Interventions  Discussed with neurology.  Continue present seizure medications. Check phenytoin level before next dose No changes at this time     Intervention Category Major Interventions: Seizures - evaluation and management  Henry Russel, P 02/04/2023, 3:56 AM

## 2023-02-04 NOTE — Progress Notes (Signed)
Brief Nutrition Note  Consult received for enteral/tube feeding initiation and management.  Adult Enteral Nutrition Protocol initiated. Full assessment to follow.  OG tube in place with tip located in stomach per xray imaging.   Admitting Dx: Respiratory failure (HCC) [J96.90] Respiratory distress [R06.03] Acute respiratory failure with hypoxia (HCC) [J96.01] Glasgow coma scale total score 3-8, in the field (EMT or ambulance) [facility]) [R40.2431] Closed nondisplaced fracture of fifth cervical vertebra, unspecified fracture morphology, initial encounter (HCC) [S12.401A]  Body mass index is 23.14 kg/m. Pt meets criteria for normal weight based on current BMI.  Labs:  Recent Labs  Lab 01/18/2023 0839 01/18/2023 1536 02/04/23 0636  NA 138 138 137  K 4.9 4.6 4.0  CL 103  --  107  CO2 26  --  20*  BUN 29*  --  26*  CREATININE 1.71*  --  1.80*  CALCIUM 8.7*  --  7.9*  MG  --   --  1.5*  PHOS  --   --  2.4*  GLUCOSE 251*  --  145*    [name], RD, LDN, CNSC.

## 2023-02-04 NOTE — Progress Notes (Signed)
Peripherally Inserted Central Catheter Placement  The IV Nurse has discussed with the patient and/or persons authorized to consent for the patient, the purpose of this procedure and the potential benefits and risks involved with this procedure.  The benefits include less needle sticks, lab draws from the catheter, and the patient may be discharged home with the catheter. Risks include, but not limited to, infection, bleeding, blood clot (thrombus formation), and puncture of an artery; nerve damage and irregular heartbeat and possibility to perform a PICC exchange if needed/ordered by physician.  Alternatives to this procedure were also discussed.  Bard Power PICC patient education guide, fact sheet on infection prevention and patient information card has been provided to patient /or left at bedside. Consent obtained from sister, [name], at bedside.   PICC Placement Documentation  PICC Triple Lumen 02/04/23 Right Basilic 39 cm 0 cm (Active)  Indication for Insertion or Continuance of Line Vasoactive infusions 02/04/23 0927  Exposed Catheter (cm) 0 cm 02/04/23 0927  Site Assessment Clean, Dry, Intact 02/04/23 0927  Lumen #1 Status Saline locked;Flushed;Blood return noted 02/04/23 0927  Lumen #2 Status Saline locked;Flushed;Blood return noted 02/04/23 0927  Lumen #3 Status Saline locked;Flushed;Blood return noted 02/04/23 0927  Dressing Type Transparent;Securing device 02/04/23 0927  Dressing Status Antimicrobial disc in place;Clean, Dry, Intact 02/04/23 0927  Safety Lock Not Applicable 02/04/23 0927  Line Care Connections checked and tightened 02/04/23 0927  Line Adjustment (NICU/IV Team Only) No 02/04/23 0927  Dressing Intervention New dressing 02/04/23 0927  Dressing Change Due 02/11/23 02/04/23 0927       Burnard Bunting Chenice 02/04/2023, 9:28 AM

## 2023-02-04 NOTE — Progress Notes (Signed)
NAME:  [name], MRN:  [id number], DOB:  1951/06/22, LOS: 1 ADMISSION DATE:  01/23/2023, CONSULTATION DATE:  01/24/2023 REFERRING MD:  Dr. Durwin Nora - EDP, CHIEF COMPLAINT:  Respiratory distress with AMS  History of Present Illness:  [name] is a 72 y.o. male with PMH significant for seizures, dementia, diabetes, HTN, HLD, and anxiety who presented from SNF after unwitnessed fall out of bed at 0630 with observed decline in mentation and progressive dyspnea post fall. On EMS arrival patient was seen hypoxic and unresponsive resulting in BMV assistance.  On ED arrival GCS 7 with no spontaneous movement seen in any extremity, he was intubated on arrival. Postintubation patient became and required low dose pressor support. Pertinent lab work on admission; glucose 251, creatinine 1.71,  WBC 18.4, Hgb 12.4, lactic 2.9. Neurosurgery was consulted and recommend medical management and additional imaging. Decision made to transfer to [facility] under PCCM for further assistance in management   Pertinent  Medical History  Seizures, TBI dementia, diabetes, HTN, HLD, and anxiety   Significant Hospital Events: Including procedures, antibiotic start and stop dates in addition to other pertinent events   7/20 presented from SNF after fall out of bed, CT C-spine with acute minimally displaced C5 fracture with larger prevertebral soft tissue mass likely hematoma   Interim History / Subjective:  Seizure like activity overnight, EEG pending Neuro recs appreciated  Objective   Blood pressure 96/61, pulse 100, temperature 99.7 F (37.6 C), temperature source Axillary, resp. rate 18, height 6' (1.829 m), weight 77.4 kg, SpO2 100%.    Vent Mode: PRVC FiO2 (%):  [50 %-100 %] 50 % Set Rate:  [18 bmp] 18 bmp Vt Set:  [560 mL-620 mL] 620 mL PEEP:  [5 cmH20] 5 cmH20 Plateau Pressure:  [15 cmH20-21 cmH20] 17 cmH20   Intake/Output Summary (Last 24 hours) at 02/04/2023 5284 Last data filed at 02/04/2023 0600 Gross  per 24 hour  Intake 3223.8 ml  Output 1360 ml  Net 1863.8 ml   Filed Weights   01/21/2023 1200 02/04/23 0500  Weight: 75.8 kg 77.4 kg    Examination: Chronically ill appearing Rhythmic movements of all 4 ext noted, intermittently purposeful Lungs scattered rhonci Heart sounds regular, ext warm Abd soft Thin secretions from trach  H/H down slightly WBC improved Pct neg Lactate yesterday noted, does seem like maybe some seizures were going on BMP pending  Resolved Hospital Problem list     Assessment & Plan:  Minimally displaced C5-6 vertebral body fracture without obvious spinal canal involvement.  -CT C-spine with acute minimally displaced C5 fracture with larger prevertebral soft tissue mass likely hematoma History of seizures -Medication reconciliation not yet completed but appears patient may be on Depakote and Keppra Dementia History of TBI Functional status okay per family as in he can converse and perform some ADLs, otherwise mostly bedbound Acute Hypoxic / Hypercapnic Respiratory Failure: MRI/CT reviewed: looks more like this is a paraspinal hematoma issue than spinal cord issue; the hematoma encroaches the airway likely leading to respiratory issues. Shock- Pct neg, UA is positive for leuks/nitrites so can tx as a UTI; MRSA swab positive but no s/s of PNA.  Lingering levo need suspect related to sedation and/or paraspinal hematoma effect on sympathetic chain. AKI- f/u repeat Cr Essential hypertension, Hyperlipidemia, DM2  - Vent bundle - Contninue AEDs as ordered - F/u EEG read; LTVEEG if concerning - SSI - Abx to ceftriaxone x 5 more days for ?complex UTI - Would lighten sedation  and if he can follow commands (plus we assure these movements are not seizures) AND family is aligned on GOC, recommend early tracheostomy while hematoma resorbs; may be better for trauma or ENT to do as the bronchoscopic view will be difficult with the posterior hematoma - f/u am  BMP  Best Practice (right click and "Reselect all SmartList Selections" daily)   Diet/type: start TF DVT prophylaxis: SCD GI prophylaxis: PPI Lines: N/A Foley:  N/A Code Status:  DNR but okay for trial of mechanical ventilation Last date of multidisciplinary goals of care discussion: Pending  Myrla Halsted MD PCCM 31 min cc time [facility] Personal contact information can be found on Amion  If no contact or response made please call [phone] 02/04/2023, 7:12 AM

## 2023-02-04 NOTE — TOC CAGE-AID Note (Signed)
Transition of Care [facility]) - CAGE-AID Screening   Patient Details  Name: [name] MRN: [id number] Date of Birth: 1950-11-19  Hewitt Shorts, RN Trauma Response Nurse Phone Number: [phone] 02/04/2023, 5:04 PM       CAGE-AID Screening: Substance Abuse Screening unable to be completed due to: : Patient unable to participate (currently intubated)

## 2023-02-04 NOTE — Progress Notes (Signed)
Pharmacy Electrolyte Replacement  Recent Labs:  Recent Labs    02/04/23 0636  K 4.0  MG 1.5*  PHOS 2.4*  CREATININE 1.80*    Low Critical Values (K </= 2.5, Phos </= 1, Mg </= 1) Present: None  Plan:  - Magnesium 4g IV x1 - Na Phos IV x1  Rexford Maus, PharmD, BCPS 02/04/2023 7:56 AM

## 2023-02-04 NOTE — Procedures (Signed)
[name]  MRN: [id number]  Epilepsy Attending: Charlsie Quest  Referring Physician/Provider: Gordy Councilman, MD  Date: 02/04/2023  Duration: 25.51 mins  Patient history: 72 y.o. male with a past medical history significant for seizures on 4 antiseizure medications, TBI (1996, reportedly nonambulatory since then), dementia, hypertension, hyperlipidemia, diabetes, anxiety. He presented on 7/20 at ~8:30 AM after having fallen out of bed with declining mentation, hypoxia progressing to unresponsiveness. EEG to evaluate for seizure.  Level of alertness: lethargic , asleep  AEDs during EEG study: LEV, PHT, VPA, Propofol, Ativan  Technical aspects: This EEG study was done with scalp electrodes positioned according to the 10-20 International system of electrode placement. Electrical activity was reviewed with band pass filter of 1-70Hz , sensitivity of 7 uV/mm, display speed of 10mm/sec with a 60Hz  notched filter applied as appropriate. EEG data were recorded continuously and digitally stored.  Video monitoring was available and reviewed as appropriate.  Description: No clear posterior dominant rhythm was seen. Sleep was characterized by vertex waves, sleep spindles (12 to 14 Hz), maximal frontocentral region. EEG showed continuous generalized and maximal left temporal 3 to 6 Hz theta-delta slowing. Hyperventilation and photic stimulation were not performed.     ABNORMALITY - Continuous slow, generalized and maximal left temporal   IMPRESSION: This study is suggestive of cortical dysfunction arising from left temporal region likely secondary to underlying encephalomalacia. Additionally there is moderate diffuse encephalopathy. No seizures or epileptiform discharges were seen throughout the recording.  Koleton Duchemin Annabelle Harman

## 2023-02-04 NOTE — Progress Notes (Signed)
RT assisted with patient transport to MRI and returned to [id number] without complications.
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
## 2023-02-04 NOTE — Progress Notes (Addendum)
   02/09/2023 2100  Neurological  Neuro Symptoms Seizure  Neuro symptoms relieved by Other (Comment) (Unrelieved by medication. MD notified and aware.)    E-Link called upon initial assessment to clarify BP parameters with the Levo. No new orders. Max of Levo with SBP > 90 or MAP > 65.    E-link called regarding onset of seizure-like activity with soft BP. Rhythmic motions on upper and lower left extremities. LR bolus ordered as well as seizure medication orders. Continued monitoring seizure-like activity and BP.   E-link notified again of continued rhythmic motion after administration of seizure medications. No new orders.   Continuing to monitor the pt and will notify MD of any changes.   Contacted by lab about CBC re-collect. Contacted lab on three separate occasions about getting the CBC recollected. I was told someone was coming to draw it and followed up. CBC was never collected. Notified lab about missing CBC. Contacted lab again to ensure 0500 labs were collected.    E-link and Dr. Katrinka Blazing were contacted after noticing that the pt had a previous code of DNR and was now a FULL code. Pt has documentation in his chart of his wishes from 12/16/2018. Contacted his next of kin, Marily Memos (pt's sister) to confirm that these were his wishes. She confirms that his wishes are to be a DNR as she was present at the time of the decision and is in present contact with the pt. Code status changed to DNR and DNR bracelet applied to the pt.

## 2023-02-04 NOTE — Progress Notes (Signed)
I was able to talk to his facility staff. They were able to check MAR. He is on Depakote 500mg  TID, Keppra 500mg  BID. He is not on Dilantin and he has Ativan only ordered as PRN. He has not gotten any PRN ativan in the last month.  Will discontinue Dilantin. Scheduled ativan was discontinued this AM.  Also followed up on rEEG which shows left temporal slowing in addition to generalized slowing.  At this time, we will signoff. Please feel free to contact us with any questions or concerns.  Erick Blinks [facility]

## 2023-02-04 NOTE — Progress Notes (Signed)
EEG complete - results pending 

## 2023-02-05 DIAGNOSIS — R0603 Acute respiratory distress: Secondary | ICD-10-CM | POA: Diagnosis not present

## 2023-02-05 DIAGNOSIS — T148XXA Other injury of unspecified body region, initial encounter: Secondary | ICD-10-CM | POA: Diagnosis present

## 2023-02-05 DIAGNOSIS — J988 Other specified respiratory disorders: Secondary | ICD-10-CM | POA: Diagnosis present

## 2023-02-05 DIAGNOSIS — Z515 Encounter for palliative care: Secondary | ICD-10-CM

## 2023-02-05 DIAGNOSIS — Z66 Do not resuscitate: Secondary | ICD-10-CM | POA: Diagnosis present

## 2023-02-05 LAB — TYPE AND SCREEN
Antibody Screen: NEGATIVE
Unit division: 0

## 2023-02-05 LAB — CBC
HCT: 19.5 % — ABNORMAL LOW (ref 39.0–52.0)
HCT: 21.7 % — ABNORMAL LOW (ref 39.0–52.0)
Hemoglobin: 6.6 g/dL — CL (ref 13.0–17.0)
Hemoglobin: 7.4 g/dL — ABNORMAL LOW (ref 13.0–17.0)
MCH: 30.7 pg (ref 26.0–34.0)
MCH: 30.7 pg (ref 26.0–34.0)
MCHC: 33.8 g/dL (ref 30.0–36.0)
MCHC: 34.1 g/dL (ref 30.0–36.0)
MCV: 90.7 fL (ref 80.0–100.0)
MCV: 90 fL (ref 80.0–100.0)
Platelets: 132 10*3/uL — ABNORMAL LOW (ref 150–400)
Platelets: 144 10*3/uL — ABNORMAL LOW (ref 150–400)
RBC: 2.15 MIL/uL — ABNORMAL LOW (ref 4.22–5.81)
RBC: 2.41 MIL/uL — ABNORMAL LOW (ref 4.22–5.81)
RDW: 14.1 % (ref 11.5–15.5)
RDW: 14 % (ref 11.5–15.5)
WBC: 10.7 10*3/uL — ABNORMAL HIGH (ref 4.0–10.5)
WBC: 11.6 10*3/uL — ABNORMAL HIGH (ref 4.0–10.5)
nRBC: 0 % (ref 0.0–0.2)
nRBC: 0 % (ref 0.0–0.2)

## 2023-02-05 LAB — BPAM RBC
Blood Product Expiration Date: 202408142359
Blood Product Expiration Date: 202408162359
Unit Type and Rh: 6200

## 2023-02-05 LAB — BASIC METABOLIC PANEL
Anion gap: 5 (ref 5–15)
BUN: 18 mg/dL (ref 8–23)
CO2: 18 mmol/L — ABNORMAL LOW (ref 22–32)
Calcium: 6.5 mg/dL — ABNORMAL LOW (ref 8.9–10.3)
Chloride: 114 mmol/L — ABNORMAL HIGH (ref 98–111)
Creatinine, Ser: 1.3 mg/dL — ABNORMAL HIGH (ref 0.61–1.24)
GFR, Estimated: 59 mL/min — ABNORMAL LOW (ref >60)
Glucose, Bld: 253 mg/dL — ABNORMAL HIGH (ref 70–99)
Potassium: 2.6 mmol/L — CL (ref 3.5–5.1)
Sodium: 137 mmol/L (ref 135–145)

## 2023-02-05 LAB — PREPARE RBC (CROSSMATCH)

## 2023-02-05 LAB — GLUCOSE, CAPILLARY
Glucose-Capillary: 120 mg/dL — ABNORMAL HIGH (ref 70–99)
Glucose-Capillary: 145 mg/dL — ABNORMAL HIGH (ref 70–99)
Glucose-Capillary: 109 mg/dL — ABNORMAL HIGH (ref 70–99)

## 2023-02-05 LAB — PHOSPHORUS: Phosphorus: 2.3 mg/dL — ABNORMAL LOW (ref 2.5–4.6)

## 2023-02-05 LAB — MAGNESIUM: Magnesium: 2 mg/dL (ref 1.7–2.4)

## 2023-02-05 LAB — LEVETIRACETAM LEVEL: Levetiracetam Lvl: 26.1 ug/mL (ref 10.0–40.0)

## 2023-02-05 LAB — LACTIC ACID, PLASMA: Lactic Acid, Venous: 1.5 mmol/L (ref 0.5–1.9)

## 2023-02-05 MED ORDER — DIPHENHYDRAMINE HCL 50 MG/ML IJ SOLN: 25.0000 mg | INTRAMUSCULAR | Status: DC | PRN

## 2023-02-05 MED ORDER — ALBUTEROL SULFATE (2.5 MG/3ML) 0.083% IN NEBU: 2.5000 mg | INHALATION_SOLUTION | RESPIRATORY_TRACT | Status: DC | PRN

## 2023-02-05 MED ORDER — POTASSIUM CHLORIDE 10 MEQ/50ML IV SOLN
10.0000 meq | INTRAVENOUS | Status: DC
  Administered 2023-02-05 (×3): 10 meq via INTRAVENOUS
  Filled 2023-02-05 (×6): qty 50

## 2023-02-05 MED ORDER — GLYCOPYRROLATE 0.2 MG/ML IJ SOLN
0.2000 mg | INTRAMUSCULAR | Status: DC | PRN
  Administered 2023-02-05: 0.2 mg via INTRAVENOUS
  Filled 2023-02-05: qty 1

## 2023-02-05 MED ORDER — POTASSIUM CHLORIDE 20 MEQ PO PACK
40.0000 meq | PACK | Freq: Two times a day (BID) | ORAL | Status: AC
  Administered 2023-02-05: 40 meq
  Filled 2023-02-05: qty 2

## 2023-02-05 MED ORDER — SODIUM CHLORIDE 0.9% IV SOLUTION: Freq: Once | INTRAVENOUS | Status: AC

## 2023-02-05 MED ORDER — MIDAZOLAM HCL 2 MG/2ML IJ SOLN: 2.0000 mg | INTRAMUSCULAR | Status: DC | PRN

## 2023-02-05 MED ORDER — ONDANSETRON HCL 4 MG/2ML IJ SOLN: 4.0000 mg | Freq: Four times a day (QID) | INTRAMUSCULAR | Status: DC | PRN

## 2023-02-05 MED ORDER — ONDANSETRON 4 MG PO TBDP: 4.0000 mg | ORAL_TABLET | Freq: Four times a day (QID) | ORAL | Status: DC | PRN

## 2023-02-05 MED ORDER — GLYCOPYRROLATE 0.2 MG/ML IJ SOLN: 0.2000 mg | INTRAMUSCULAR | Status: DC | PRN

## 2023-02-05 MED ORDER — VALPROIC ACID 250 MG/5ML PO SOLN: 500.0000 mg | Freq: Three times a day (TID) | ORAL | Status: DC

## 2023-02-05 MED ORDER — MORPHINE 100MG IN NS 100ML (1MG/ML) PREMIX INFUSION
0.0000 mg/h | INTRAVENOUS | Status: DC
  Administered 2023-02-05: 5 mg/h via INTRAVENOUS
  Filled 2023-02-05: qty 100

## 2023-02-05 MED ORDER — SODIUM CHLORIDE 0.9 % IV SOLN: INTRAVENOUS | Status: DC

## 2023-02-05 MED ORDER — GLYCOPYRROLATE 1 MG PO TABS: 1.0000 mg | ORAL_TABLET | ORAL | Status: DC | PRN

## 2023-02-05 MED ORDER — POLYVINYL ALCOHOL 1.4 % OP SOLN: 1.0000 [drp] | Freq: Four times a day (QID) | OPHTHALMIC | Status: DC | PRN

## 2023-02-05 MED ORDER — MORPHINE BOLUS VIA INFUSION
5.0000 mg | INTRAVENOUS | Status: DC | PRN
  Administered 2023-02-05 (×2): 5 mg via INTRAVENOUS

## 2023-02-06 LAB — TYPE AND SCREEN
ABO/RH(D): A POS
Unit division: 0

## 2023-02-06 LAB — PHENYTOIN LEVEL, FREE AND TOTAL
Phenytoin, Free: NOT DETECTED ug/mL (ref 1.0–2.0)
Phenytoin, Total: <0.8 ug/mL — ABNORMAL LOW (ref 10.0–20.0)

## 2023-02-06 LAB — CULTURE, BLOOD (ROUTINE X 2)
Culture: NO GROWTH
Culture: NO GROWTH

## 2023-02-06 LAB — BPAM RBC: Unit Type and Rh: 6200

## 2023-02-08 LAB — CULTURE, BLOOD (ROUTINE X 2)

## 2023-02-15 NOTE — Progress Notes (Signed)
Family have met with clergy. They have come to patient 's nurse Bebe Shaggy RN and told her they are ready to extubate the patient. He is already a DNR and he never wanted to be intubated.  Sister Marily Memos and cousin are in the room with the patient . They all verbalized that they prayed about this and they feel it is in God's hands. They would like to extubate at this time. They understand he will either survive or pass away and they are at peace with both outcomes.  Comfort Care Orders and extubation orders have been placed.  Bevelyn Ngo, MSN, AGACNP-BC [place] [facility] See Amion for personal pager PCCM on call pager [phone]

## 2023-02-15 NOTE — Progress Notes (Signed)
250 mL bag of unopened fentanyl wasted with Reynold Bowen, RN per Pharmacy.   01/19/2023 Oralia Manis, RN 4:21 PM

## 2023-02-15 NOTE — Progress Notes (Signed)
TOD 1342. This RN Oralia Manis) and Roselyn Bering, RN confirmed TOD. No heart beat heard while auscultating for 1 minute.  [name] (Sister) and other family members present for death.  Post- Mortem flowsheet was completed. Pt is not a candidate for donation. As requested by the ME, the ME will be notified when pt is in morgue.   Feb 08, 2023 Oralia Manis, RN 2:11 PM

## 2023-02-15 NOTE — Progress Notes (Signed)
NAME:  [name], MRN:  [id number], DOB:  07-15-1951, LOS: 2 ADMISSION DATE:  02/13/2023, CONSULTATION DATE:  02/12/2023 REFERRING MD:  Dr. Durwin Nora - EDP, CHIEF COMPLAINT:  Respiratory distress with AMS  History of Present Illness:  [name] is a 72 y.o. male with PMH significant for seizures, dementia, diabetes, HTN, HLD, and anxiety who presented from SNF after unwitnessed fall out of bed at 0630 with observed decline in mentation and progressive dyspnea post fall. On EMS arrival patient was seen hypoxic and unresponsive resulting in BMV assistance.  On ED arrival GCS 7 with no spontaneous movement seen in any extremity, he was intubated on arrival. Postintubation patient became and required low dose pressor support. Pertinent lab work on admission; glucose 251, creatinine 1.71,  WBC 18.4, Hgb 12.4, lactic 2.9. Neurosurgery was consulted and recommend medical management and additional imaging. Decision made to transfer to [facility] under PCCM for further assistance in management   Pertinent  Medical History  Seizures, TBI dementia, diabetes, HTN, HLD, and anxiety   Significant Hospital Events: Including procedures, antibiotic start and stop dates in addition to other pertinent events   7/20 presented from SNF after fall out of bed, CT C-spine with acute minimally displaced C5 fracture with larger prevertebral soft tissue mass likely hematoma  February 07, 2023 Remains intubated, HGB drop overnight to 6.6, 7.4 on re-check  Interim History / Subjective:  No Seizure like activity overnight, EEG  shows Continuous slow, generalized and maximal left temporal . Suggestive of cortical dysfunction arising from left temporal region likely secondary to underlying encephalomalacia . Moderate diffuse encephalopathy. No seizures or epileptiform discharges were seen throughout the recording.  HGB drop to 7.4    Objective   Blood pressure 98/62, pulse 87, temperature 97.8 F (36.6 C), temperature source Axillary,  resp. rate (!) 26, height 6' (1.829 m), weight 78.6 kg, SpO2 99%.    Vent Mode: PRVC FiO2 (%):  [40 %] 40 % Set Rate:  [18 bmp] 18 bmp Vt Set:  [620 mL] 620 mL PEEP:  [5 cmH20] 5 cmH20 Plateau Pressure:  [14 cmH20-19 cmH20] 19 cmH20   Intake/Output Summary (Last 24 hours) at 2023-02-07 0947 Last data filed at Feb 07, 2023 0932 Gross per 24 hour  Intake 2689.99 ml  Output 4575 ml  Net -1885.01 ml   Filed Weights   01/22/2023 1200 02/04/23 0500 02/07/23 0449  Weight: 75.8 kg 77.4 kg 78.6 kg    Examination: Chronically ill appearing, sedated and intubated  MAE x 4, intermittently purposeful Bilateral chest excursion , Lungs scattered rhonci, few wheezes per bases  Heart sounds regular, ext warm, per tele SR Abd soft, NT, BS +, Body mass index is 23.5 kg/m.  Thin secretions from ETT  H/H down to 7.4 this morning, Drop from 12.4 two days ago to 7.4 today No obvious source  WBC 11.6, platelets 144 Pct neg Lactate to 1.5 on February 07, 2023 BMP pending Mag 2 K 2.6 / Cl 114, CO2 18,Creatinine 1.3, Calcium 6.5  Resolved Hospital Problem list     Assessment & Plan:  Minimally displaced C5-6 vertebral body fracture without obvious spinal canal involvement.  -CT C-spine with acute minimally displaced C5 fracture with larger prevertebral soft tissue mass likely hematoma - HGB dropping from 12.4 to 7.4 on 7/11 Plan - Will re scan soft tissue neck to ensure hematoma is not enlarging - Will recheck HGB 02-07-2023 afternoon to ensure he does not need transfusion  - Will check PT/ INR to ensure this is  not coagulation issue  History of seizures Plan - Continue Keppra and Depakote as ordered - EEG as needed for seizure activity - LTVEEG if concerning  Dementia Plan - Supportive care - Goals of care   Acute Hypoxic / Hypercapnic Respiratory Failure: MRI/CT reviewed: looks more like this is a paraspinal hematoma issue than spinal cord issue; the hematoma encroaches the airway likely leading to  respiratory issues. Plan Continue ventilator support with lung protective strategies  Wean PEEP and FiO2 for sats greater than 90%. Head of bed elevated 30 degrees. Plateau pressures less than 30 cm H20.  Follow intermittent chest x-ray and ABG.   SAT/SBT as tolerated, mentation preclude extubation  Ensure adequate pulmonary hygiene  Follow cultures  VAP bundle in place  PAD protocol  - SSI - Abx to ceftriaxone x 4 more days for ?complex UTI - Would lighten sedation and if he can follow commands (plus we assure these movements are not seizures) AND family is aligned on GOC, recommend early tracheostomy while hematoma resorbs; may be better for trauma or ENT to do as the bronchoscopic view will be difficult with the posterior hematoma   Shock- Pct neg, UA is positive for leuks/nitrites so can tx as a UTI; MRSA swab positive but no s/s of PNA.  Lingering levo need suspect related to sedation and/or paraspinal hematoma effect on sympathetic chain. Plan - ceftriaxone x 4 more days for ?complex UTI - Trend fever curve and WBC  AKI-Hypokalemia Plan - Trend BMET - Replete lytes as needed   Essential hypertension, Hyperlipidemia, DM2 Currently on levophed Plan - Hold antihypertensives for now - Titrate levophed as needed to maintain sats of > 65 mm HG  Dr. Judeth Horn spoke with Sister this morning regarding  goals of care . As patient had previous desire for DNR and no intubation , she is considering withdrawal of care. Clergy is coming today 7/22  to meet with family. They will make a decision after. Initial plans for repeat CT Soft tissue neck ordered to evaluate enlarging hematoma with dropping HGB, has been discontinued until family makes decision . APP Time : 30 minutes  Best Practice (right click and "Reselect all SmartList Selections" daily)   Diet/type: start TF DVT prophylaxis: SCD GI prophylaxis: PPI Lines: N/A Foley:  N/A Code Status:  DNR but okay for trial of mechanical  ventilation Last date of multidisciplinary goals of care discussion: Pending   Bevelyn Ngo, MSN, AGACNP-BC [facility] Pulmonary/Critical Care Medicine See Amion for personal pager PCCM on call pager [phone]  01/28/2023, 9:47 AM

## 2023-02-15 NOTE — IPAL (Signed)
  Interdisciplinary Goals of Care Family Meeting   Date carried out: 16-Feb-2023  Location of the meeting: Bedside  Member's involved: Physician, Bedside Registered Nurse, and Family Member or next of kin  Durable Power of Attorney or acting medical decision maker: sister present    Discussion: We discussed goals of care for [employer] .  Bedbound nearly totally dependent for ADL at SNF since 1990s. Poor QOL. Has MOST form states DNR not life support. Discussed trach which he would not want. Sister feels he would not want current life support. Has spoken about going to heaven to see mom and dad, brother who have preceded him. After discussion sister elects comfort care.   Code status:   Code Status: DNR   Disposition: In-patient comfort care  Time spent for the meeting: n/a    Karren Burly, MD  2023-02-16, 3:10 PM

## 2023-02-15 NOTE — Death Summary Note (Signed)
DEATH SUMMARY   Patient Details  Name: [name] MRN: [id number] DOB: 1950-10-25  Admission/Discharge Information   Admit Date:  2023-02-04  Date of Death: Date of Death: 02-06-2023  Time of Death: Time of Death: 02-18-41  Length of Stay: 2  Referring Physician: Butch Penny, MD (Inactive)   Reason(s) for Hospitalization  Airway obstruction due to external compression from traumatic cervical spine hematoma  Diagnoses  Preliminary cause of death: Airway obstruction due to external compression from traumatic cervical spine hematoma Secondary Diagnoses (including complications and co-morbidities):  Principal Problem:   Respiratory failure (HCC) Active Problems:   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 year old male who presented with fall. Intubated with respiratory disterss. CT revealed cervical spine hematoma compressing airway. No cord compression. NSGY said NTD. No real improvement over time. Baseline bed bound at SNF dependent for nearly all ADL. He has MOST form stating DNR prior to arrival. Would not want life support. Family elected for terminal extubation and comfort care.     Pertinent Labs and Studies  Significant Diagnostic Studies US RENAL  Result Date: 02/04/2023 CLINICAL DATA:  [id number] AKI (acute kidney injury) (HCC) [id number] EXAM: RENAL / URINARY TRACT ULTRASOUND COMPLETE COMPARISON:  CT from previous day FINDINGS: Right Kidney: Renal measurements: 9.2 x 5.6 x 4 cm = volume: 104 mL. Parenchyma echogenic compared to adjacent liver. No mass or hydronephrosis visualized. Left Kidney: Renal measurements: 11 x 6 x 5.1 cm = volume: 173 mL. Mild hydronephrosis.  No focal lesion identified. Bladder: Decompressed by Foley catheter. Other: None. IMPRESSION: 1. Mild left hydronephrosis. 2. Echogenic parenchyma suggesting medical renal disease. Electronically Signed   By: Corlis Leak M.D.   On: 02/04/2023 08:58   EEG adult  Result Date: 02/04/2023 Charlsie Quest, MD     02/04/2023  8:33 AM [name] MRN: [id number] Epilepsy Attending: Charlsie Quest Referring Physician/Provider: Gordy Councilman, MD Date: 02/04/2023 Duration: 25.51 mins Patient history: 72 y.o. male with a past medical history significant for seizures on 4 antiseizure medications, TBI 1995-02-19, reportedly nonambulatory since then), dementia, hypertension, hyperlipidemia, diabetes, anxiety. He presented on 2023/02/04 at ~8:30 AM after having fallen out of bed with declining mentation, hypoxia progressing to unresponsiveness. EEG to evaluate for seizure. Level of alertness: lethargic , asleep AEDs during EEG study: LEV, PHT, VPA, Propofol, Ativan Technical aspects: This EEG study was done with scalp electrodes positioned according to the 10-20 International system of electrode placement. Electrical activity was reviewed with band pass filter of 1-70Hz , sensitivity of 7 uV/mm, display speed of 62mm/sec with a 60Hz  notched filter applied as appropriate. EEG data were recorded continuously and digitally stored.  Video monitoring was available and reviewed as appropriate. Description: No clear posterior dominant rhythm was seen. Sleep was characterized by vertex waves, sleep spindles (12 to 14 Hz), maximal frontocentral region. EEG showed continuous generalized and maximal left temporal 3 to 6 Hz theta-delta slowing. Hyperventilation and photic stimulation were not performed.   ABNORMALITY - Continuous slow, generalized and maximal left temporal IMPRESSION: This study is suggestive of cortical dysfunction arising from left temporal region likely secondary to underlying encephalomalacia. Additionally  there is moderate diffuse encephalopathy. No seizures or epileptiform discharges were seen throughout the recording. Charlsie Quest   [place] EKG SITE RITE  Result Date: 02/04/2023 [facility] image not attached, placement could not be confirmed due to current cardiac rhythm.  MR Cervical Spine W and Wo Contrast  Result Date: 02/04/2023 CLINICAL DATA:  Initial evaluation for acute trauma, fracture. EXAM: MRI CERVICAL SPINE WITHOUT AND WITH CONTRAST TECHNIQUE: Multiplanar and multiecho pulse sequences of the cervical spine, to include the craniocervical junction and cervicothoracic junction, were obtained without and with intravenous contrast. CONTRAST:  7.20mL GADAVIST GADOBUTROL 1 MMOL/ML IV SOLN COMPARISON:  CT from earlier the same day. FINDINGS: Alignment: Straightening of the normal cervical lordosis. Trace degenerative retrolisthesis of C2 on C3. Vertebrae: Previously identified acute minimally displaced fracture traversing the C5 vertebral body again seen, stable from prior CT. No visible extension through the posterior elements. Vertebral body height is relatively maintained. No other acute or recent fracture seen elsewhere within the cervical spine. Underlying bone marrow signal intensity within normal limits. Findings compatible with dish with prominent bridging anterior osteophytic spurring at C3-4 through C5-6 again noted. No discrete or worrisome osseous lesions. No other abnormal marrow edema or enhancement. Cord: Normal signal and morphology. No evidence for traumatic cord injury. No epidural hematoma or other collection. Major ligamentous structures appear intact. Posterior Fossa, vertebral arteries, paraspinal tissues: Cerebral and cerebellar atrophy noted within the visualized brain. Craniocervical junction normal. Mild posttraumatic edema noted within the posterior paraspinous soft tissues. Large prevertebral/retropharyngeal collection with surrounding edema seen extending from approximately  C4-5 through the visualized upper thoracic spine (series 12, image 4). No associated enhancement. This most likely reflects a large posttraumatic hematoma. Mass effect on the visualized upper aero digestive tract with mild anterior and displacement of the visualized aero digestive tract. Enteric and endotracheal tubes in place. Normal flow voids seen within the vertebra tube Earl arteries bilaterally. Disc levels: C2-C3: Trace retrolisthesis. Minimal disc bulge. Left-sided facet hypertrophy. No significant spinal stenosis. Foramina remain patent. C3-C4: Minimal disc bulge with endplate spurring. No spinal stenosis. Mild left C4 foraminal narrowing. Right neural foramen remains patent. C4-C5: Disc desiccation without significant disc bulge. Anterior bridging osteophytic spurring. No significant spinal stenosis. Left-sided uncovertebral disease with resultant mild to moderate left C5 foraminal stenosis. Right neural foramen remains patent. C5-C6: Anterior bridging osteophytic spurring without significant disc bulge. No spinal stenosis. Left-sided uncovertebral spurring with resultant mild left C6 foraminal narrowing. Right neural foramen remains widely patent. C6-C7: Minimal disc bulge with bilateral uncovertebral spurring. No spinal stenosis. Mild left C7 foraminal narrowing. Right neural foramen remains patent. C7-T1: Disc desiccation without significant disc bulge. Mild left greater than right facet hypertrophy. No canal or foraminal stenosis. IMPRESSION: 1. Acute minimally displaced fracture involving the C5 vertebral body, stable from prior CT. No other acute traumatic injury within the cervical spine. No evidence for traumatic cord injury. 2. Large prevertebral/retropharyngeal collection with surrounding edema, most likely reflecting a large posttraumatic hematoma. Mass effect on the visualized upper aero digestive tract. 3. Underlying mild for age cervical spondylosis without significant spinal stenosis. Mild  to moderate left C4 through C7 foraminal narrowing as above. 4. Findings compatible with DISH, with bridging anterior osteophytic spurring at C3-4 through C5-6. Electronically Signed   By: Rise Mu M.D.   On: 01/20/2023 19:32   CT HEAD WO CONTRAST  Result Date: 02/09/2023 CLINICAL DATA:  Mental status change.  Un witnessed fall. EXAM: CT HEAD WITHOUT CONTRAST CT CERVICAL SPINE  WITHOUT CONTRAST TECHNIQUE: Multidetector CT imaging of the head and cervical spine was performed following the standard protocol without intravenous contrast. Multiplanar CT image reconstructions of the cervical spine were also generated. RADIATION DOSE REDUCTION: This exam was performed according to the departmental dose-optimization program which includes automated exposure control, adjustment of the mA and/or kV according to patient size and/or use of iterative reconstruction technique. COMPARISON:  CT head 08/01/2019 and CT cervical spine 12/27/2015. FINDINGS: CT HEAD FINDINGS Brain: No evidence of acute infarction, hemorrhage, hydrocephalus, extra-axial collection or mass lesion/mass effect. There is diffuse cerebral atrophy and signs of chronic small vessel ischemic disease. Left frontotemporal encephalomalacia is identified compatible with remote infarct. There is also a remote infarct noted within the right cerebellar hemisphere. Prominence of the sulci and ventricles compatible with brain atrophy. Vascular: No hyperdense vessel or unexpected calcification. Skull: Normal. Negative for fracture or focal lesion. Sinuses/Orbits: Paranasal sinuses are clear.  Orbits appear normal. Other: None CT CERVICAL SPINE FINDINGS Alignment: The alignment appears within normal limits. The facet joints appear well aligned. Skull base and vertebrae: There is an acute, nondisplaced fracture involving the C5 vertebral body. The fracture line spans the AP diameter of the vertebral body. Extension into the C5-6 disc space is identified,  image 30/5. There is also extension through the right posteroinferior cortex. No retropulsion of fracture fragments identified. This is a new finding when compared with the exam from 01/04/2016. No additional fracture or subluxation identified. Soft tissues and spinal canal: No prevertebral fluid or swelling. No visible canal hematoma. Disc levels: Multilevel disc space narrowing with large, ventral osteophytes. Upper chest: Imaged portions of the lung apices are unremarkable. Other: There is a large heterogeneous mass within the precervical/retropharyngeal soft tissues. This measures 4.7 x 3.5 by at least 9.5 cm. This has mass effect upon the cervical airway and esophagus which is displaced ventrally. Signs of intubation and NG tube placement. IMPRESSION: 1. No acute intracranial abnormalities. 2. Chronic small vessel ischemic disease and brain atrophy. 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 3. Acute, two column, nondisplaced fracture involving the C5 vertebral body with extension into the C5-6 disc space. No retropulsion of fracture fragments identified. 4. Large heterogeneous mass within the pre-cervical/retropharyngeal soft tissues. This has mass effect upon the cervical airway and esophagus which are displaced ventrally. Favor large hematoma in light of recent trauma. A underlying tumor is less favored but cannot be excluded with a high degree of certainty. Follow-up imaging advised to ensure resolution. In the absence of resolution further investigation with contrast enhance MRI or CT of the cervical spine is advised. 5. Cervical degenerative disc disease. Electronically Signed   By: Signa Kell M.D.   On: 01/26/2023 10:16   CT Cervical Spine Wo Contrast  Result Date: 02/07/2023 CLINICAL DATA:  Neck trauma with mental status change. Unwitnessed fall this morning. EXAM: CT CERVICAL SPINE WITHOUT CONTRAST TECHNIQUE: Multidetector CT  imaging of the cervical spine was performed without intravenous contrast. Multiplanar CT image reconstructions were also generated. RADIATION DOSE REDUCTION: This exam was performed according to the departmental dose-optimization program which includes automated exposure control, adjustment of the mA and/or kV according to patient size and/or use of iterative reconstruction technique. COMPARISON:  CT cervical spine 12/27/2015 FINDINGS: Alignment: Normal. Skull base and vertebrae: There are changes of diffuse idiopathic skeletal hyperostosis with partially bridging anterior osteophytes at C4-5 and C5-6 (rigid spine). There is an acute oblique fracture through the C5 vertebral body,  best seen on the sagittal and coronal images. No involvement of the foramen transverse area or posterior elements. The fracture is minimally displaced. No other fractures are identified. Soft tissues and spinal canal: Endotracheal and nasogastric tubes are in place. Both are displaced anteriorly by a slightly heterogeneous oval prevertebral soft tissue mass measuring up to 4.6 x 3.5 cm in diameter. This extends superiorly to C3-4 and inferiorly into the upper chest, most likely a prevertebral hematoma related to the cervical spine fracture. Mass lesion difficult to exclude by this examination. Disc levels: Mild multilevel spondylosis with disc bulging and endplate osteophytes. As above, progressive partially bridging anterior osteophytes at C4-5 and C5-6. No significant spinal stenosis or foraminal narrowing. No evidence of epidural hematoma. Upper chest: Chest CT findings are dictated separately. Other: None. IMPRESSION: 1. Acute minimally displaced fracture through the C5 vertebral body without involvement of the foramen or posterior elements. 2. Underlying diffuse idiopathic skeletal hyperostosis with partially bridging anterior osteophytes at C4-5 and C5-6 (rigid spine). 3. Large prevertebral soft tissue mass, most likely an acute  hematoma related to the cervical spine fracture. Mass lesion difficult to exclude by this examination. In discussion with Dr. Durwin Nora, there was no resistance to placement of the enteric tube to suggest an esophageal lesion. 4. No evidence of epidural hematoma or significant spinal stenosis. 5. Critical Value/emergent results were called by telephone at the time of interpretation on 02/06/2023 at 9:58 am to provider Gloris Manchester , who verbally acknowledged these results. Electronically Signed   By: Carey Bullocks M.D.   On: 01/19/2023 09:59   CT CHEST ABDOMEN PELVIS WO CONTRAST  Result Date: 01/23/2023 CLINICAL DATA:  Polytrauma, blunt EXAM: CT CHEST, ABDOMEN AND PELVIS WITHOUT CONTRAST TECHNIQUE: Multidetector CT imaging of the chest, abdomen and pelvis was performed following the standard protocol without IV contrast. RADIATION DOSE REDUCTION: This exam was performed according to the departmental dose-optimization program which includes automated exposure control, adjustment of the mA and/or kV according to patient size and/or use of iterative reconstruction technique. COMPARISON:  None Available. FINDINGS: CT CHEST FINDINGS Cardiovascular: Heart size normal. No pericardial effusion. Scattered descending thoracic aortic calcifications. Central great vessels otherwise grossly unremarkable. Mediastinum/Nodes: Large lower cervical prevertebral hematoma extends into the posterior mediastinum. No mass or adenopathy. Lungs/Pleura: No pleural effusion. No pneumothorax. Scattered coarse posterior lower lobe subsegmental atelectasis or scarring. Musculoskeletal: No chest wall mass or suspicious bone lesions identified. CT ABDOMEN PELVIS FINDINGS Hepatobiliary: Multiple large partially calcified gallstones up to 3.3 cm. Gallbladder nondilated. No focal liver lesion or biliary ductal dilatation. Pancreas: Unremarkable. No pancreatic ductal dilatation or surrounding inflammatory changes. Spleen: Normal in size without focal  abnormality. Adrenals/Urinary Tract: No adrenal mass. Bilateral nephrolithiasis, largest in the left lower pole 1.1 cm, on the right 5 mm in the lower pole. No hydronephrosis. Symmetric renal contours. Urinary bladder physiologically distended. Stomach/Bowel: Gastric tube loops in the nondistended stomach. Small bowel is decompressed. Appendix not identified. Moderate colonic fecal material mild rectal distension. Vascular/Lymphatic: Scattered aortoiliac calcified plaque without aneurysm. No abdominal or pelvic adenopathy. Reproductive: Mild prostate enlargement. Bilateral inguinal hernias containing only fat. Other: . no ascites.  No free air. Musculoskeletal: Mild L1 vertebral compression deformity, age indeterminate. Corticated fragments posterior to the right hip suggesting free osteochondral bodies. IMPRESSION: 1. Large lower cervical prevertebral hematoma extending into the posterior mediastinum. 2. Mild L1 vertebral compression deformity, age indeterminate. 3. Cholelithiasis. 4. Bilateral nephrolithiasis. 5.  Aortic Atherosclerosis (ICD10-I70.0). Electronically Signed   By: Ronald Pippins.D.  On: 02/04/2023 09:57   DG Chest Portable 1 View  Result Date: 02/02/2023 CLINICAL DATA:  s/p ETT AND OG EXAM: PORTABLE CHEST - 1 VIEW COMPARISON:  08/01/2019 FINDINGS: Endotracheal tube placement, tip 2 cm above carina. Gastric tube loops in the decompressed stomach. No pneumothorax. Lungs are clear. Heart size and mediastinal contours are within normal limits. No effusion. Visualized bones unremarkable. IMPRESSION: Endotracheal and gastric tube placement as above. No acute findings. Electronically Signed   By: Corlis Leak M.D.   On: 02/10/2023 09:14    Microbiology Recent Results (from the past 240 hour(s))  Resp panel by RT-PCR (RSV, Flu A&B, Covid) Anterior Nasal Swab     Status: None   Collection Time: 01/30/2023  9:47 AM   Specimen: Anterior Nasal Swab  Result Value Ref Range Status   SARS Coronavirus 2 by  RT PCR NEGATIVE NEGATIVE Final    Comment: (NOTE) SARS-CoV-2 target nucleic acids are NOT DETECTED.  The SARS-CoV-2 RNA is generally detectable in upper respiratory specimens during the acute phase of infection. The lowest concentration of SARS-CoV-2 viral copies this assay can detect is 138 copies/mL. A negative result does not preclude SARS-Cov-2 infection and should not be used as the sole basis for treatment or other patient management decisions. A negative result may occur with  improper specimen collection/handling, submission of specimen other than nasopharyngeal swab, presence of viral mutation(s) within the areas targeted by this assay, and inadequate number of viral copies(<138 copies/mL). A negative result must be combined with clinical observations, patient history, and epidemiological information. The expected result is Negative.  Fact Sheet for Patients:  BloggerCourse.com  Fact Sheet for Healthcare Providers:  SeriousBroker.it  This test is no t yet approved or cleared by the [place] FDA and  has been authorized for detection and/or diagnosis of SARS-CoV-2 by FDA under an Emergency Use Authorization (EUA). This EUA will remain  in effect (meaning this test can be used) for the duration of the COVID-19 declaration under Section 564(b)(1) of the Act, 21 U.S.C.section 360bbb-3(b)(1), unless the authorization is terminated  or revoked sooner.       Influenza A by PCR NEGATIVE NEGATIVE Final   Influenza B by PCR NEGATIVE NEGATIVE Final    Comment: (NOTE) The Xpert Xpress SARS-CoV-2/FLU/RSV plus assay is intended as an aid in the diagnosis of influenza from Nasopharyngeal swab specimens and should not be used as a sole basis for treatment. Nasal washings and aspirates are unacceptable for Xpert Xpress SARS-CoV-2/FLU/RSV testing.  Fact Sheet for Patients: BloggerCourse.com  Fact Sheet  for Healthcare Providers: SeriousBroker.it  This test is not yet approved or cleared by the [place] FDA and has been authorized for detection and/or diagnosis of SARS-CoV-2 by FDA under an Emergency Use Authorization (EUA). This EUA will remain in effect (meaning this test can be used) for the duration of the COVID-19 declaration under Section 564(b)(1) of the Act, 21 U.S.C. section 360bbb-3(b)(1), unless the authorization is terminated or revoked.     Resp Syncytial Virus by PCR NEGATIVE NEGATIVE Final    Comment: (NOTE) Fact Sheet for Patients: BloggerCourse.com  Fact Sheet for Healthcare Providers: SeriousBroker.it  This test is not yet approved or cleared by the [place] FDA and has been authorized for detection and/or diagnosis of SARS-CoV-2 by FDA under an Emergency Use Authorization (EUA). This EUA will remain in effect (meaning this test can be used) for the duration of the COVID-19 declaration under Section 564(b)(1) of the Act, 21 U.S.C. section 360bbb-3(b)(1),  unless the authorization is terminated or revoked.  Performed at [facility], [address] [place], [place] [address]   Blood Culture (routine x 2)     Status: None (Preliminary result)   Collection Time: 02/09/2023 10:47 AM   Specimen: BLOOD LEFT HAND  Result Value Ref Range Status   Specimen Description BLOOD LEFT HAND BOTTLES DRAWN AEROBIC ONLY  Final   Special Requests Blood Culture adequate volume  Final   Culture   Final    NO GROWTH 2 DAYS Performed at [facility], [address] [place], [place] [address]    Report Status PENDING  Incomplete  Blood Culture (routine x 2)     Status: None (Preliminary result)   Collection Time: 02-09-23 11:20 AM   Specimen: BLOOD LEFT FOREARM  Result Value Ref Range Status   Specimen Description   Final    BLOOD LEFT FOREARM BOTTLES DRAWN AEROBIC AND ANAEROBIC   Special Requests    Final    Blood Culture results may not be optimal due to an excessive volume of blood received in culture bottles   Culture   Final    NO GROWTH 2 DAYS Performed at [facility], [address] [place], [place] [address]    Report Status PENDING  Incomplete  MRSA Next Gen by PCR, Nasal     Status: Abnormal   Collection Time: 02/09/23  2:35 PM   Specimen: Nasal Mucosa; Nasal Swab  Result Value Ref Range Status   MRSA by PCR Next Gen DETECTED (A) NOT DETECTED Final    Comment: CRITICAL RESULT CALLED TO, READ BACK BY AND VERIFIED WITH: RN Lady Deutscher [phone] [id number] BY J RAZZAK,MT (NOTE) The GeneXpert MRSA Assay (FDA approved for NASAL specimens only), is one component of a comprehensive MRSA colonization surveillance program. It is not intended to diagnose MRSA infection nor to guide or monitor treatment for MRSA infections. Test performance is not FDA approved in patients less than 40 years old. Performed at [facility], [address] [place], [place] [address]   Urine Culture     Status: Abnormal   Collection Time: 2023/02/09  2:54 PM   Specimen: Urine, Random  Result Value Ref Range Status   Specimen Description URINE, RANDOM  Final   Special Requests NONE Reflexed from [id number]  Final   Culture (A)  Final    <10,000 COLONIES/mL INSIGNIFICANT GROWTH Performed at [facility], [address] [place], [place] [address]    Report Status 02/04/2023 FINAL  Final    Lab Basic Metabolic Panel: Recent Labs  Lab 02-09-23 0839 02/09/2023 1536 02/04/23 0636 02/04/2023 0744  NA 138 138 137 137  K 4.9 4.6 4.0 2.6*  CL 103  --  107 114*  CO2 26  --  20* 18*  GLUCOSE 251*  --  145* 253*  BUN 29*  --  26* 18  CREATININE 1.71*  --  1.80* 1.30*  CALCIUM 8.7*  --  7.9* 6.5*  MG  --   --  1.5* 2.0  PHOS  --   --  2.4* 2.3*   Liver Function Tests: Recent Labs  Lab 02/09/23 0839  AST 23  ALT 15  ALKPHOS 99  BILITOT 0.8  PROT 7.0  ALBUMIN 3.3*   No results for  input(s): "LIPASE", "AMYLASE" in the last 168 hours. No results for input(s): "AMMONIA" in the last 168 hours. CBC: Recent Labs  Lab 02/09/2023 [phone] 02-09-2023 1536 02/04/23 0636 02/02/2023 0437 01/21/2023  0744  WBC 18.4*  --  16.5* 10.7* 11.6*  NEUTROABS 8.5*  --   --   --   --   HGB 12.4* 10.9* 9.4* 6.6* 7.4*  HCT 38.0* 32.0* 26.3* 19.5* 21.7*  MCV 95.7  --  89.8 90.7 90.0  PLT 297  --  196 132* 144*   Cardiac Enzymes: No results for input(s): "CKTOTAL", "CKMB", "CKMBINDEX", "TROPONINI" in the last 168 hours. Sepsis Labs: Recent Labs  Lab 01/16/2023 0839 02/10/2023 1047 02/09/2023 1304 02/04/23 0636 Feb 21, 2023 0437 02-21-23 0744  WBC 18.4*  --   --  16.5* 10.7* 11.6*  LATICACIDVEN  --  2.9* 4.1*  --  1.5  --     Procedures/Operations  As per EMR   Lesia Sago Elliyah Liszewski 2023-02-21, 3:16 PM

## 2023-02-15 DEATH — deceased
